# Patient Record
Sex: Female | Born: 1995 | Race: Black or African American | Hispanic: No | Marital: Single | Smoking: Current every day smoker
Health system: Southern US, Community
[De-identification: ages and names within clinical notes are randomized; demographics above are authoritative.]

## PROBLEM LIST (undated history)

## (undated) ENCOUNTER — Ambulatory Visit (HOSPITAL_COMMUNITY): Admission: EM | Payer: Medicaid Other | Source: Home / Self Care

## (undated) ENCOUNTER — Ambulatory Visit (HOSPITAL_COMMUNITY): Discharge status: Absent without leave | Payer: Medicaid Other

## (undated) ENCOUNTER — Inpatient Hospital Stay (HOSPITAL_COMMUNITY): Payer: Self-pay

## (undated) DIAGNOSIS — B009 Herpesviral infection, unspecified: Secondary | ICD-10-CM

## (undated) DIAGNOSIS — O139 Gestational [pregnancy-induced] hypertension without significant proteinuria, unspecified trimester: Secondary | ICD-10-CM

## (undated) DIAGNOSIS — N390 Urinary tract infection, site not specified: Secondary | ICD-10-CM

## (undated) DIAGNOSIS — D649 Anemia, unspecified: Secondary | ICD-10-CM

## (undated) HISTORY — PX: NO PAST SURGERIES: SHX2092

---
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
## 2016-03-01 IMAGING — US US ABDOMEN COMPLETE
1 series · 14 of 25 positions shown · non-contrast
Comparison: None.

CLINICAL DATA: Periumbilical pain for 2 days.

EXAM:
ULTRASOUND ABDOMEN COMPLETE

[Series 1: us abdomen complete · 0.22mm/px · 14 of 73 slices shown]
[im 1/73]
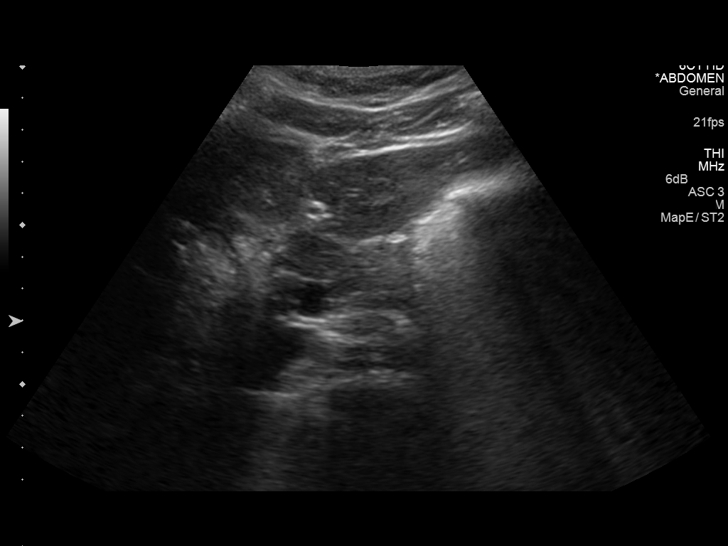
[im 7/73]
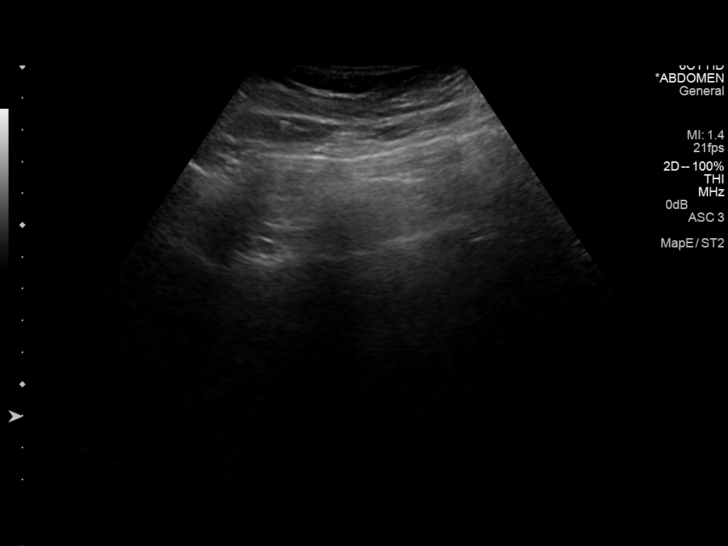
[im 13/73]
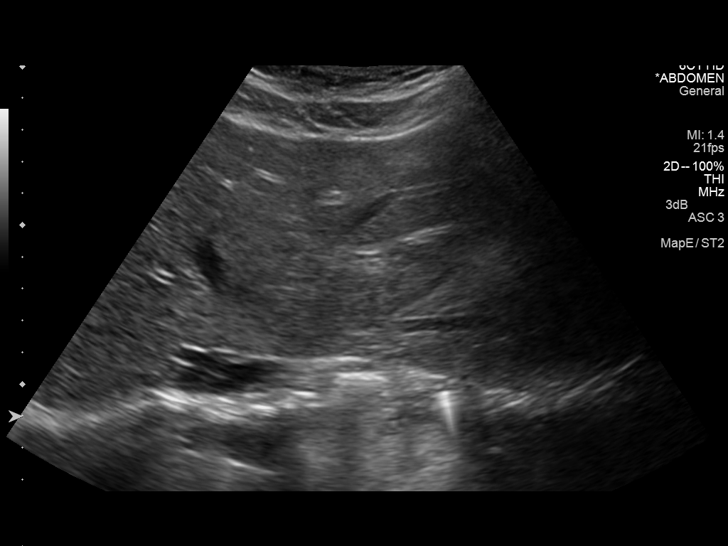
[im 19/73]
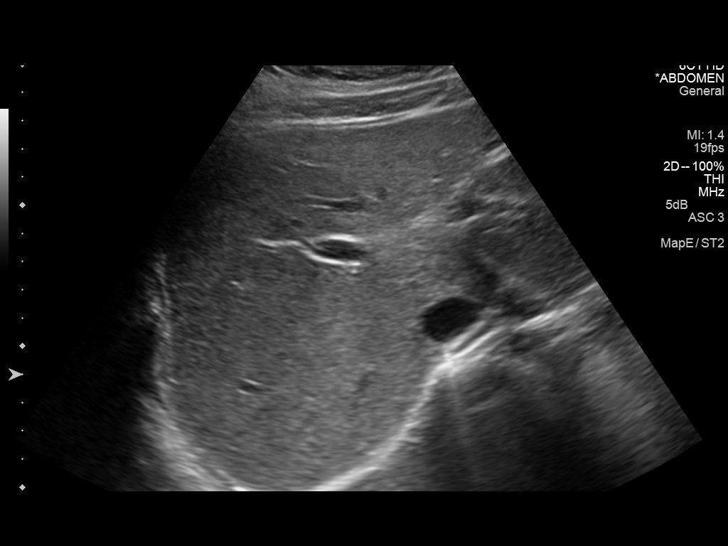
[im 25/73]
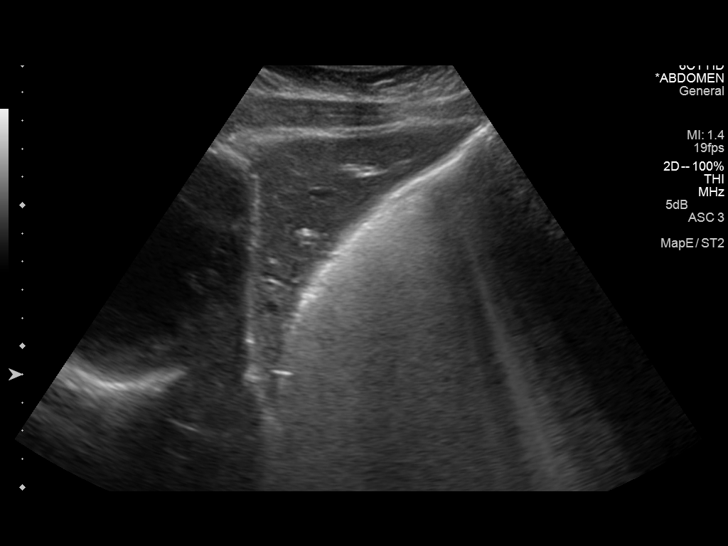
[im 28/73]
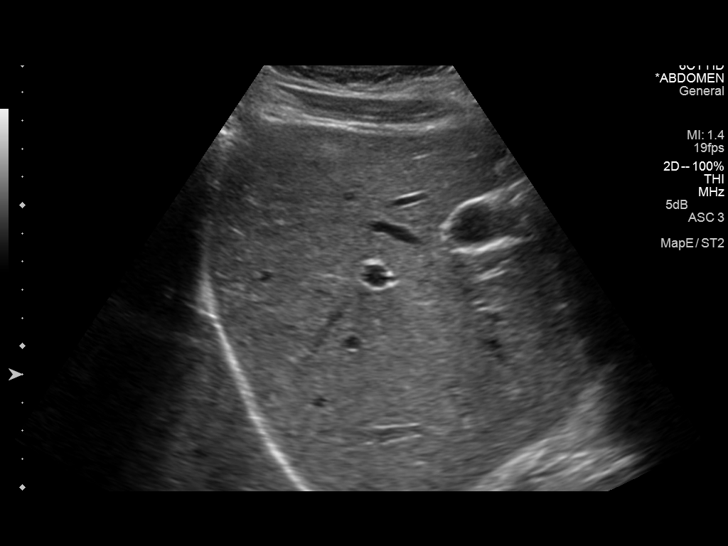
[im 34/73]
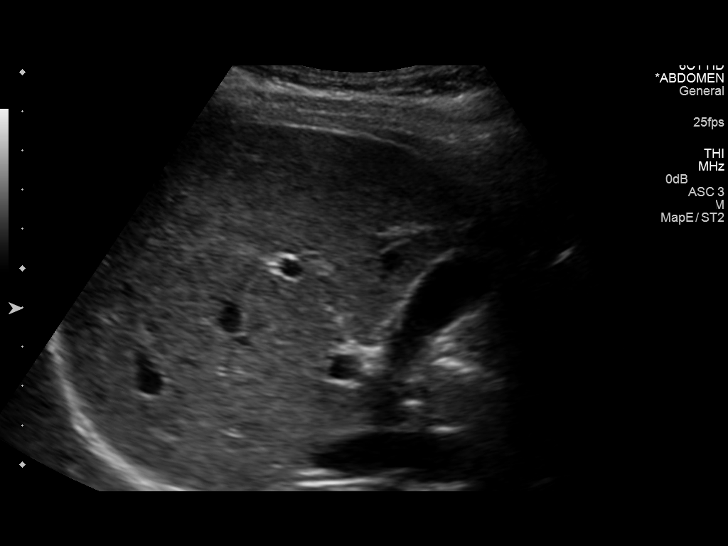
[im 40/73]
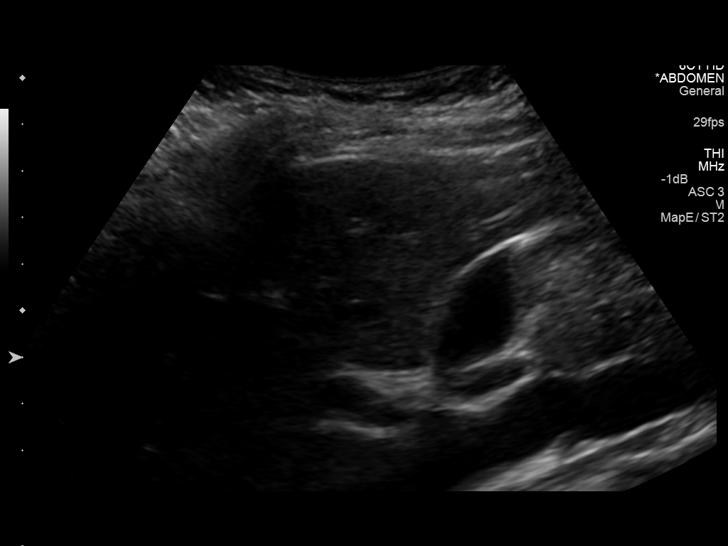
[im 46/73]
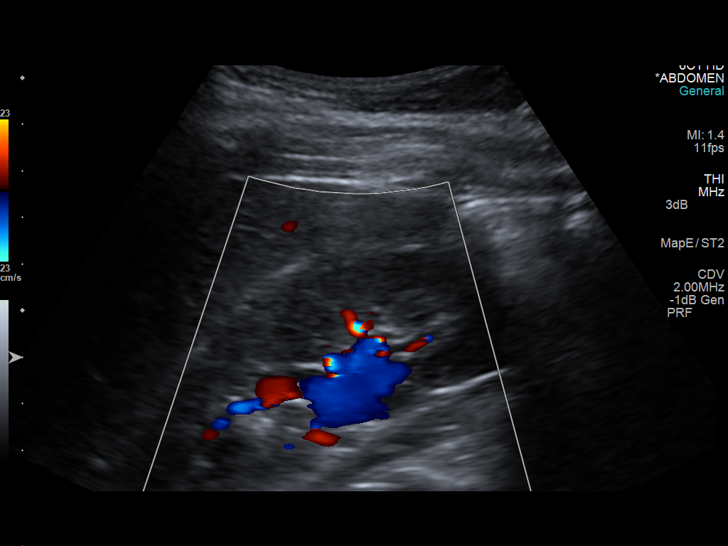
[im 49/73]
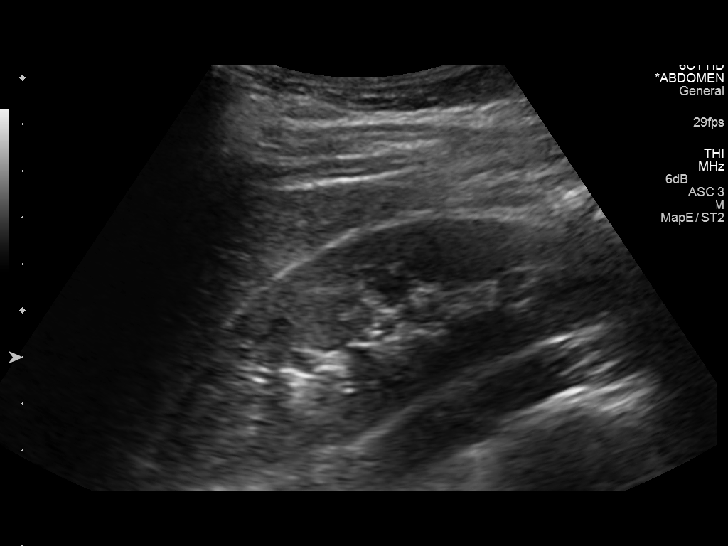
[im 55/73]
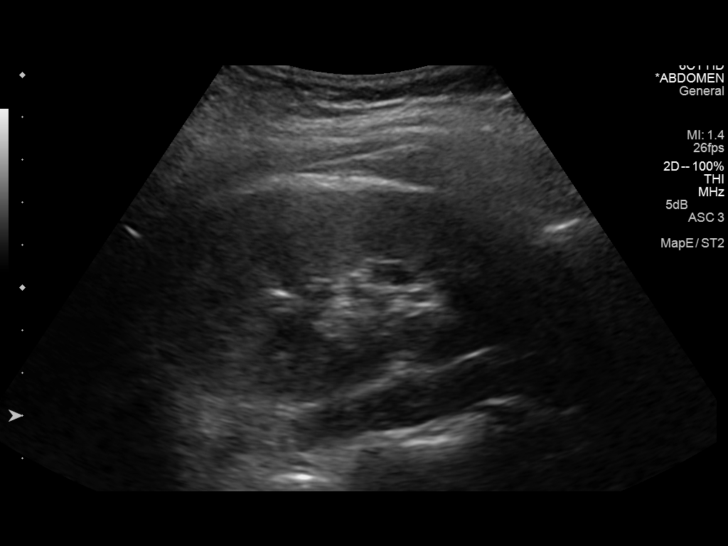
[im 61/73]
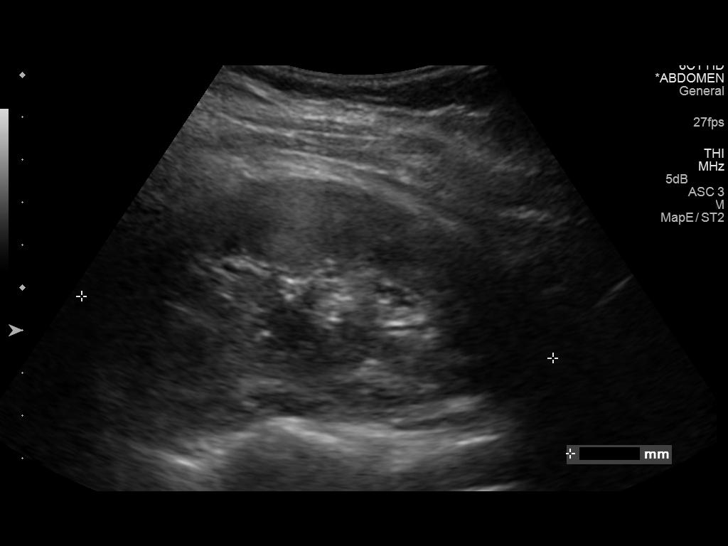
[im 67/73]
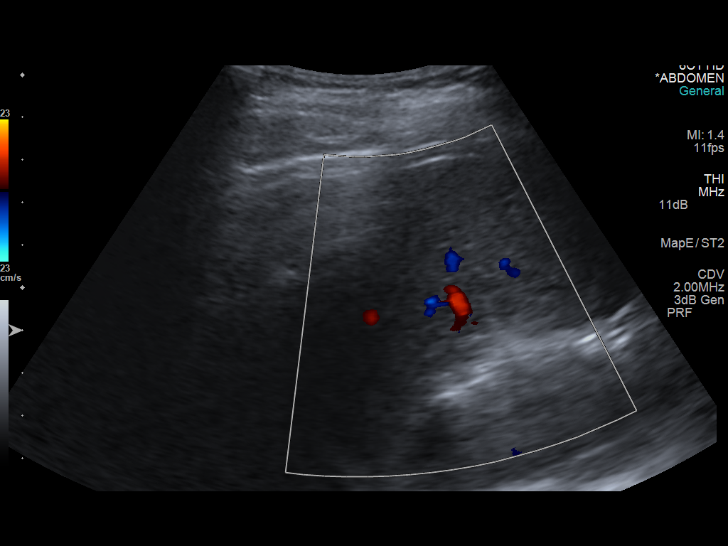
[im 73/73]
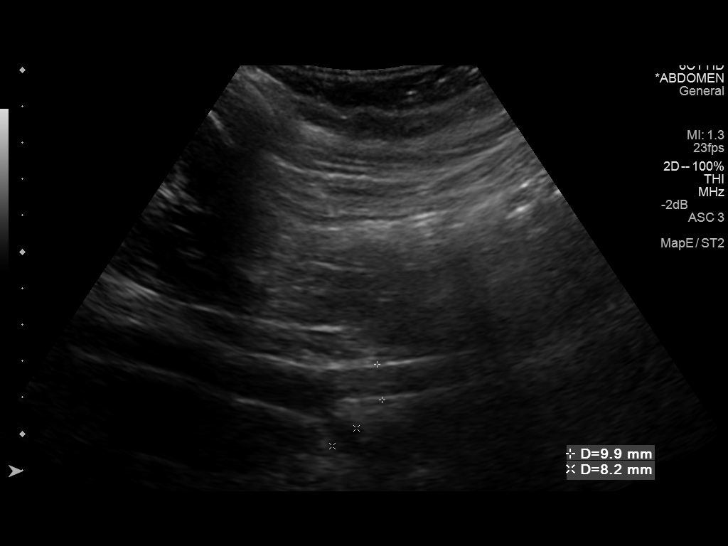

[14 of 25 positions shown; findings below may reference images not displayed]

FINDINGS: Gallbladder: No gallstones or wall thickening visualized. No
sonographic Murphy sign noted.

Common bile duct: Diameter: 3.5 mm.

Liver: No focal lesion identified. Within normal limits in
parenchymal echogenicity.

IVC: No abnormality visualized.

Pancreas: Visualized portion unremarkable.

Spleen: Size and appearance within normal limits.

Right Kidney: Length: 10.4 cm. Echogenicity within normal limits. No
mass or hydronephrosis visualized.

Left Kidney: Length: 10.8 cm. Echogenicity within normal limits. No
mass or hydronephrosis visualized.

Abdominal aorta: No aneurysm visualized.

Other findings: None.
IMPRESSION: 1. Normal abdominal sonogram. No findings to explain patient's
periumbilical pain

## 2016-07-11 NOTE — L&D Delivery Note (Signed)
Patient is 21 y.o. G2P1001 6239w0d admitted for IOL for GHTN.   Delivery Note At 2255, a viable female was delivered via  SVD, Presentation: cephalic,LOA. APGAR: 9,9 ; weight pending.   Placenta status: spontaneous, intact. Cord: 3 vessel  Anesthesia:  epidural Episiotomy:  none Lacerations:  none Suture Repair: n/a Est. Blood Loss (mL): 250  Mom to postpartum.  Baby to Couplet care / Skin to Skin.  Rolm BookbinderAmber Mafalda Mcginniss, DO [facility]

## 2016-11-26 ENCOUNTER — Encounter (HOSPITAL_COMMUNITY): Payer: Self-pay | Admitting: Emergency Medicine

## 2016-11-26 ENCOUNTER — Ambulatory Visit (HOSPITAL_COMMUNITY)
Admission: EM | Admit: 2016-11-26 | Discharge: 2016-11-26 | Disposition: A | Discharge status: Home / Self Care | Payer: Medicaid Other | Attending: Internal Medicine | Admitting: Internal Medicine

## 2016-11-26 DIAGNOSIS — N898 Other specified noninflammatory disorders of vagina: Secondary | ICD-10-CM | POA: Diagnosis not present

## 2016-11-26 DIAGNOSIS — R112 Nausea with vomiting, unspecified: Secondary | ICD-10-CM

## 2016-11-26 DIAGNOSIS — Z3201 Encounter for pregnancy test, result positive: Secondary | ICD-10-CM | POA: Diagnosis not present

## 2016-11-26 DIAGNOSIS — R109 Unspecified abdominal pain: Secondary | ICD-10-CM | POA: Diagnosis not present

## 2016-11-26 DIAGNOSIS — Z3A01 Less than 8 weeks gestation of pregnancy: Secondary | ICD-10-CM

## 2016-11-26 LAB — POCT URINALYSIS DIP (DEVICE)
Bilirubin Urine: NEGATIVE
Glucose, UA: NEGATIVE mg/dL
Ketones, ur: NEGATIVE mg/dL
Nitrite: NEGATIVE
PH: 7 (ref 5.0–8.0)
Protein, ur: NEGATIVE mg/dL
SPECIFIC GRAVITY, URINE: 1.02 (ref 1.005–1.030)
Urobilinogen, UA: 2 mg/dL — ABNORMAL HIGH (ref 0.0–1.0)

## 2016-11-26 LAB — POCT PREGNANCY, URINE: PREG TEST UR: POSITIVE — AB

## 2016-11-26 MED ORDER — PRENATAL COMPLETE 14-0.4 MG PO TABS: 1.0000 | ORAL_TABLET | ORAL | 0 refills | Status: AC

## 2016-11-26 MED ORDER — DOXYLAMINE-PYRIDOXINE 10-10 MG PO TBEC: 1.0000 | DELAYED_RELEASE_TABLET | Freq: Four times a day (QID) | ORAL | 0 refills | Status: DC | PRN

## 2016-11-26 NOTE — ED Provider Notes (Signed)
CSN: [id number]     Arrival date & time 11/26/16  1434 History   First MD Initiated Contact with Patient 11/26/16 1556     Chief Complaint  Patient presents with  . Abdominal Pain  . Possible Pregnancy   (Consider location/radiation/quality/duration/timing/severity/associated sxs/prior Treatment) 21 year old female presents to clinic with a chief complaint of morning sickness. States she has been vomiting most mornings for the last 2-3 weeks. And she has also had a clear vaginal discharge. She denies any abdominal pain, and she denies any vaginal bleeding. She recently had a nexplenon implant placed. Her last menstrual cycle was 10/22/2016. She is sexually active, without condom use.   The history is provided by the patient.  Abdominal Pain  Associated symptoms: nausea, vaginal discharge (clear) and vomiting   Associated symptoms: no diarrhea, no dysuria and no vaginal bleeding   Possible Pregnancy  Pertinent negatives include no abdominal pain.    Past Medical History:  Diagnosis Date  . Medical history non-contributory    Past Surgical History:  Procedure Laterality Date  . NO PAST SURGERIES     History reviewed. No pertinent family history. Social History  Substance Use Topics  . Smoking status: Current Some Day Smoker  . Smokeless tobacco: Never Used  . Alcohol use Yes   OB History    Gravida Para Term Preterm AB Living   1 1 1  0 0 1   SAB TAB Ectopic Multiple Live Births   0 0 0 0 1     Review of Systems  Respiratory: Negative.   Cardiovascular: Negative.   Gastrointestinal: Positive for nausea and vomiting. Negative for abdominal pain and diarrhea.  Genitourinary: Positive for vaginal discharge (clear). Negative for dysuria, frequency, menstrual problem, pelvic pain and vaginal bleeding.  Musculoskeletal: Negative.   Skin: Negative.   Neurological: Negative.     Allergies  Peanuts [peanut oil] and Nickel  Home Medications   Prior to Admission medications    Medication Sig Start Date End Date Taking? Authorizing Provider  Doxylamine-Pyridoxine (DICLEGIS) 10-10 MG TBEC Take 1 tablet by mouth 4 (four) times daily as needed. 11/26/16   Dorena Bodo, NP  etonogestrel (NEXPLANON) 68 MG IMPL implant 1 each (68 mg total) by Subdermal route once. 10/06/14   Montez Morita, CNM  Prenatal Vit-Fe Fumarate-FA (PRENATAL COMPLETE) 14-0.4 MG TABS Take 1 tablet by mouth 1 day or 1 dose. 11/26/16 11/27/16  Dorena Bodo, NP   Meds Ordered and Administered this Visit  Medications - No data to display  BP 131/69 (BP Location: Right Arm)   Pulse 87   Temp 99.5 F (37.5 C) (Oral)   Resp 18   LMP 10/22/2016   SpO2 100%   Breastfeeding? No  No data found.   Physical Exam  Constitutional: She is oriented to person, place, and time. She appears well-developed and well-nourished. No distress.  HENT:  Head: Normocephalic and atraumatic.  Right Ear: External ear normal.  Left Ear: External ear normal.  Eyes: Conjunctivae are normal.  Neck: Normal range of motion.  Cardiovascular: Normal rate and regular rhythm.   Pulmonary/Chest: Effort normal and breath sounds normal.  Abdominal: Soft. Bowel sounds are normal.  Neurological: She is alert and oriented to person, place, and time.  Skin: Skin is warm and dry. Capillary refill takes less than 2 seconds. No rash noted. She is not diaphoretic. No erythema.  Psychiatric: She has a normal mood and affect. Her behavior is normal.  Nursing note and vitals reviewed.  Urgent Care Course     Procedures (including critical care time)  Labs Review Labs Reviewed  POCT URINALYSIS DIP (DEVICE) - Abnormal; Notable for the following:       Result Value   Hgb urine dipstick TRACE (*)    Urobilinogen, UA 2.0 (*)    Leukocytes, UA TRACE (*)    All other components within normal limits  POCT PREGNANCY, URINE - Abnormal; Notable for the following:    Preg Test, Ur POSITIVE (*)    All other components within  normal limits    Imaging Review No results found.    MDM   1. Less than [redacted] weeks gestation of pregnancy    Pregnancy test positive, started on prenatal vitamins, diclegis for nausea, [facility]. Counseled to go to the ER if she has any vaginal bleeding or abdominal/pelvic pain.    Dorena BodoKennard, Hester Forget, NP 11/26/16 1616

## 2016-11-26 NOTE — ED Triage Notes (Signed)
Pt c/o constant abd pain onset 3 weeks associated w/vomiting and spotting  Pt believes she may be pregnant... sts she took a pregnancy test at home it came back positive and [facility]  LMP = 10/22/2016  A&O x4... NAD... Ambulatory

## 2016-11-26 NOTE — Discharge Instructions (Signed)
You have a positive of pregnancy test. If you have any abdominal pain, or vaginal bleeding, [facility]. I have started you on prenatal vitamins, and a medicine for nausea called Diclegis. I have made a referral to the women's outpatient center, called them Monday morning to schedule an appointment for follow-up care.

## 2017-02-02 LAB — OB RESULTS CONSOLE RUBELLA ANTIBODY, IGM: Rubella: IMMUNE

## 2017-02-02 LAB — OB RESULTS CONSOLE HIV ANTIBODY (ROUTINE TESTING): HIV: NONREACTIVE

## 2017-02-02 LAB — OB RESULTS CONSOLE GC/CHLAMYDIA: Gonorrhea: NEGATIVE

## 2017-04-19 LAB — OB RESULTS CONSOLE RPR: RPR: NONREACTIVE

## 2017-05-16 ENCOUNTER — Inpatient Hospital Stay (HOSPITAL_COMMUNITY)
Admission: AD | Admit: 2017-05-16 | Discharge: 2017-05-16 | Disposition: A | Discharge status: Home / Self Care | Payer: Medicaid Other | Source: Ambulatory Visit | Attending: Obstetrics & Gynecology | Admitting: Obstetrics & Gynecology

## 2017-05-16 ENCOUNTER — Encounter (HOSPITAL_COMMUNITY): Payer: Self-pay | Admitting: *Deleted

## 2017-05-16 DIAGNOSIS — O26893 Other specified pregnancy related conditions, third trimester: Secondary | ICD-10-CM | POA: Diagnosis not present

## 2017-05-16 DIAGNOSIS — Z7982 Long term (current) use of aspirin: Secondary | ICD-10-CM | POA: Insufficient documentation

## 2017-05-16 DIAGNOSIS — R102 Pelvic and perineal pain: Secondary | ICD-10-CM | POA: Diagnosis present

## 2017-05-16 DIAGNOSIS — Z3A32 32 weeks gestation of pregnancy: Secondary | ICD-10-CM | POA: Insufficient documentation

## 2017-05-16 DIAGNOSIS — N949 Unspecified condition associated with female genital organs and menstrual cycle: Secondary | ICD-10-CM

## 2017-05-16 DIAGNOSIS — R109 Unspecified abdominal pain: Secondary | ICD-10-CM | POA: Diagnosis not present

## 2017-05-16 HISTORY — DX: Urinary tract infection, site not specified: N39.0

## 2017-05-16 HISTORY — DX: Gestational (pregnancy-induced) hypertension without significant proteinuria, unspecified trimester: O13.9

## 2017-05-16 HISTORY — DX: Herpesviral infection, unspecified: B00.9

## 2017-05-16 HISTORY — DX: Anemia, unspecified: D64.9

## 2017-05-16 LAB — URINALYSIS, ROUTINE W REFLEX MICROSCOPIC
Bilirubin Urine: NEGATIVE
GLUCOSE, UA: NEGATIVE mg/dL
HGB URINE DIPSTICK: NEGATIVE
Ketones, ur: NEGATIVE mg/dL
NITRITE: NEGATIVE
Protein, ur: 100 mg/dL — AB
SPECIFIC GRAVITY, URINE: 1.025 (ref 1.005–1.030)
pH: 6 (ref 5.0–8.0)

## 2017-05-16 LAB — WET PREP, GENITAL
SPERM: NONE SEEN
Trich, Wet Prep: NONE SEEN
Yeast Wet Prep HPF POC: NONE SEEN

## 2017-05-16 NOTE — MAU Provider Note (Signed)
History     CSN: [id number]  Arrival date and time: 05/16/17 1349   First Provider Initiated Contact with Patient 05/16/17 1453      Chief Complaint  Patient presents with  . Abdominal Pain   Samantha Warner is a 21 y.o. G2P1001 at 5596w3d who presents today with abdominal pain x 3 days. She denies any VB or LOF. She reports normal fetal movement.    Abdominal Pain  This is a new problem. Episode onset: 3 days ago. The onset quality is sudden. The problem occurs constantly. The problem has been unchanged. The pain is located in the suprapubic region. The pain is at a severity of 7/10. The quality of the pain is aching. The abdominal pain does not radiate. Pertinent negatives include no constipation (last BM today), dysuria, fever, frequency, nausea or vomiting. Nothing aggravates the pain. The pain is relieved by nothing. She has tried nothing for the symptoms.    Past Medical History:  Diagnosis Date  . Anemia   . Herpes   . Pregnancy induced hypertension   . UTI (urinary tract infection)     Past Surgical History:  Procedure Laterality Date  . NO PAST SURGERIES      History reviewed. No pertinent family history.  Social History   Tobacco Use  . Smoking status: Former Smoker    Types: Cigarettes    Last attempt to quit: 10/14/2016    Years since quitting: 0.5  . Smokeless tobacco: Never Used  Substance Use Topics  . Alcohol use: Yes  . Drug use: Yes    Types: Marijuana, Cocaine    Comment: occ marijuana use during pregnancy    Allergies:  Allergies  Allergen Reactions  . Peanuts [Peanut Oil] Anaphylaxis  . Nickel Rash    Medications Prior to Admission  Medication Sig Dispense Refill Last Dose  . aspirin EC 81 MG tablet Take 81 mg daily by mouth.   05/15/2017 at Unknown time  . Prenatal Vit-Fe Fumarate-FA (PRENATAL MULTIVITAMIN) TABS tablet Take 1 tablet daily at 12 noon by mouth.   05/15/2017 at Unknown time  . Doxylamine-Pyridoxine (DICLEGIS) 10-10 MG TBEC  Take 1 tablet by mouth 4 (four) times daily as needed. (Patient not taking: Reported on 05/16/2017) 60 tablet 0 Not Taking at Unknown time    Review of Systems  Constitutional: Negative for chills and fever.  Gastrointestinal: Positive for abdominal pain. Negative for constipation (last BM today), nausea and vomiting.  Genitourinary: Negative for dysuria, frequency, urgency, vaginal bleeding and vaginal discharge.   Physical Exam   Blood pressure 130/72, pulse 94, temperature 98.8 F (37.1 C), temperature source Oral, resp. rate 17, height 5\' 6"  (1.676 m), weight 186 lb (84.4 kg), last menstrual period 10/22/2016, SpO2 100 %.  Physical Exam  Nursing note and vitals reviewed. Constitutional: She is oriented to person, place, and time. She appears well-developed and well-nourished. No distress.  HENT:  Head: Normocephalic.  Cardiovascular: Normal rate.  Respiratory: Effort normal.  GI: Soft. There is no tenderness. There is no rebound.  Genitourinary:  Genitourinary Comments: Cervix closed/thick   Neurological: She is alert and oriented to person, place, and time.  Skin: Skin is warm and dry.  Psychiatric: She has a normal mood and affect.   FHT: 120, moderate with 15x15 accels, no decels Toco: no UCs   Results for orders placed or performed during the hospital encounter of 05/16/17 (from the past 24 hour(s))  Urinalysis, Routine w reflex microscopic  Status: Abnormal   Collection Time: 05/16/17  2:13 PM  Result Value Ref Range   Color, Urine AMBER (A) YELLOW   APPearance HAZY (A) CLEAR   Specific Gravity, Urine 1.025 1.005 - 1.030   pH 6.0 5.0 - 8.0   Glucose, UA NEGATIVE NEGATIVE mg/dL   Hgb urine dipstick NEGATIVE NEGATIVE   Bilirubin Urine NEGATIVE NEGATIVE   Ketones, ur NEGATIVE NEGATIVE mg/dL   Protein, ur 811100 (A) NEGATIVE mg/dL   Nitrite NEGATIVE NEGATIVE   Leukocytes, UA TRACE (A) NEGATIVE   RBC / HPF 0-5 0 - 5 RBC/hpf   WBC, UA 6-30 0 - 5 WBC/hpf   Bacteria, UA  RARE (A) NONE SEEN   Squamous Epithelial / LPF 6-30 (A) NONE SEEN   Mucus PRESENT   Wet prep, genital     Status: Abnormal   Collection Time: 05/16/17  3:09 PM  Result Value Ref Range   Yeast Wet Prep HPF POC NONE SEEN NONE SEEN   Trich, Wet Prep NONE SEEN NONE SEEN   Clue Cells Wet Prep HPF POC PRESENT (A) NONE SEEN   WBC, Wet Prep HPF POC MODERATE (A) NONE SEEN   Sperm NONE SEEN     MAU Course  Procedures  MDM   Assessment and Plan   1. Round ligament pain   2. Abdominal pain during pregnancy in third trimester   3. [redacted] weeks gestation of pregnancy    DC home Comfort measures reviewed  3rd Trimester precautions  PTL precautions  Fetal kick counts RX: no new RX, continue current meds. Patient is not taking ASA daily. Advised patient on the importance of this medication in preventing complications.  Return to MAU as needed FU with OB as planned  Follow-up Information    [facility]   Specialty:  Obstetrics and Gynecology Contact information: [address] [place] [id number] [phone]           Thressa ShellerHeather Hogan 05/16/2017, 3:41 PM

## 2017-05-16 NOTE — Discharge Instructions (Signed)

## 2017-05-16 NOTE — MAU Note (Signed)
Pt reports abd pain x 3 days, states it feels achy in her lower abd, denies bleeding.

## 2017-05-17 LAB — GC/CHLAMYDIA PROBE AMP (~~LOC~~) NOT AT ARMC
Chlamydia: NEGATIVE
Neisseria Gonorrhea: NEGATIVE

## 2017-06-14 ENCOUNTER — Telehealth (HOSPITAL_COMMUNITY): Payer: Self-pay | Admitting: *Deleted

## 2017-06-14 ENCOUNTER — Other Ambulatory Visit: Payer: Self-pay | Admitting: Advanced Practice Midwife

## 2017-06-14 NOTE — Telephone Encounter (Signed)
Preadmission screen  

## 2017-06-17 ENCOUNTER — Inpatient Hospital Stay (HOSPITAL_COMMUNITY)
Admission: RE | Admit: 2017-06-17 | Discharge: 2017-06-19 | DRG: 807 | Disposition: A | Discharge status: Home / Self Care | Payer: Medicaid Other | Source: Ambulatory Visit | Attending: Obstetrics and Gynecology | Admitting: Obstetrics and Gynecology

## 2017-06-17 ENCOUNTER — Inpatient Hospital Stay (HOSPITAL_COMMUNITY): Payer: Medicaid Other | Admitting: Anesthesiology

## 2017-06-17 DIAGNOSIS — Z3A37 37 weeks gestation of pregnancy: Secondary | ICD-10-CM | POA: Diagnosis not present

## 2017-06-17 DIAGNOSIS — Z87891 Personal history of nicotine dependence: Secondary | ICD-10-CM

## 2017-06-17 DIAGNOSIS — O134 Gestational [pregnancy-induced] hypertension without significant proteinuria, complicating childbirth: Principal | ICD-10-CM | POA: Diagnosis present

## 2017-06-17 DIAGNOSIS — O9902 Anemia complicating childbirth: Secondary | ICD-10-CM | POA: Diagnosis present

## 2017-06-17 DIAGNOSIS — Z7982 Long term (current) use of aspirin: Secondary | ICD-10-CM | POA: Diagnosis not present

## 2017-06-17 DIAGNOSIS — D649 Anemia, unspecified: Secondary | ICD-10-CM | POA: Diagnosis present

## 2017-06-17 LAB — COMPREHENSIVE METABOLIC PANEL
ALK PHOS: 156 U/L — AB (ref 38–126)
ALT: 20 U/L (ref 14–54)
ANION GAP: 10 (ref 5–15)
AST: 28 U/L (ref 15–41)
Albumin: 3.4 g/dL — ABNORMAL LOW (ref 3.5–5.0)
BILIRUBIN TOTAL: 0.4 mg/dL (ref 0.3–1.2)
BUN: 6 mg/dL (ref 6–20)
CALCIUM: 9.6 mg/dL (ref 8.9–10.3)
CO2: 19 mmol/L — ABNORMAL LOW (ref 22–32)
Chloride: 106 mmol/L (ref 101–111)
Creatinine, Ser: 0.46 mg/dL (ref 0.44–1.00)
Glucose, Bld: 93 mg/dL (ref 65–99)
Potassium: 3.9 mmol/L (ref 3.5–5.1)
SODIUM: 135 mmol/L (ref 135–145)
TOTAL PROTEIN: 6.8 g/dL (ref 6.5–8.1)

## 2017-06-17 LAB — PROTEIN / CREATININE RATIO, URINE
CREATININE, URINE: 92 mg/dL
PROTEIN CREATININE RATIO: 0.25 mg/mg{creat} — AB (ref 0.00–0.15)
TOTAL PROTEIN, URINE: 23 mg/dL

## 2017-06-17 LAB — CBC
HCT: 30.4 % — ABNORMAL LOW (ref 36.0–46.0)
HEMATOCRIT: 32.1 % — AB (ref 36.0–46.0)
Hemoglobin: 10.4 g/dL — ABNORMAL LOW (ref 12.0–15.0)
Hemoglobin: 9.6 g/dL — ABNORMAL LOW (ref 12.0–15.0)
MCH: 23.3 pg — AB (ref 26.0–34.0)
MCH: 23.9 pg — AB (ref 26.0–34.0)
MCHC: 31.6 g/dL (ref 30.0–36.0)
MCHC: 32.4 g/dL (ref 30.0–36.0)
MCV: 73.6 fL — AB (ref 78.0–100.0)
MCV: 73.8 fL — AB (ref 78.0–100.0)
PLATELETS: 243 10*3/uL (ref 150–400)
PLATELETS: 297 10*3/uL (ref 150–400)
RBC: 4.12 MIL/uL (ref 3.87–5.11)
RBC: 4.36 MIL/uL (ref 3.87–5.11)
RDW: 14.3 % (ref 11.5–15.5)
RDW: 14.4 % (ref 11.5–15.5)
WBC: 15.6 10*3/uL — ABNORMAL HIGH (ref 4.0–10.5)
WBC: 9.2 10*3/uL (ref 4.0–10.5)

## 2017-06-17 LAB — RAPID URINE DRUG SCREEN, HOSP PERFORMED
Amphetamines: NOT DETECTED
Barbiturates: NOT DETECTED
Benzodiazepines: NOT DETECTED
Cocaine: NOT DETECTED
Opiates: NOT DETECTED
Tetrahydrocannabinol: NOT DETECTED

## 2017-06-17 LAB — TYPE AND SCREEN
ABO/RH(D): O POS
Antibody Screen: NEGATIVE

## 2017-06-17 LAB — GROUP B STREP BY PCR: GROUP B STREP BY PCR: NEGATIVE

## 2017-06-17 LAB — RPR: RPR Ser Ql: NONREACTIVE

## 2017-06-17 MED ORDER — DIPHENHYDRAMINE HCL 50 MG/ML IJ SOLN: 12.5000 mg | INTRAMUSCULAR | Status: DC | PRN

## 2017-06-17 MED ORDER — PHENYLEPHRINE 40 MCG/ML (10ML) SYRINGE FOR IV PUSH (FOR BLOOD PRESSURE SUPPORT)
80.0000 ug | PREFILLED_SYRINGE | INTRAVENOUS | Status: DC | PRN
  Filled 2017-06-17: qty 5
  Filled 2017-06-17: qty 10

## 2017-06-17 MED ORDER — OXYTOCIN 40 UNITS IN LACTATED RINGERS INFUSION - SIMPLE MED
2.5000 [IU]/h | INTRAVENOUS | Status: DC
  Administered 2017-06-17: 2.5 [IU]/h via INTRAVENOUS

## 2017-06-17 MED ORDER — OXYTOCIN BOLUS FROM INFUSION
500.0000 mL | Freq: Once | INTRAVENOUS | Status: AC
  Administered 2017-06-17: 500 mL via INTRAVENOUS

## 2017-06-17 MED ORDER — ACETAMINOPHEN 325 MG PO TABS: 650.0000 mg | ORAL_TABLET | ORAL | Status: DC | PRN

## 2017-06-17 MED ORDER — OXYTOCIN 40 UNITS IN LACTATED RINGERS INFUSION - SIMPLE MED
1.0000 m[IU]/min | INTRAVENOUS | Status: DC
  Administered 2017-06-17: 2 m[IU]/min via INTRAVENOUS
  Filled 2017-06-17: qty 1000

## 2017-06-17 MED ORDER — FENTANYL 2.5 MCG/ML BUPIVACAINE 1/10 % EPIDURAL INFUSION (WH - ANES)
14.0000 mL/h | INTRAMUSCULAR | Status: DC | PRN
  Administered 2017-06-17: 14 mL/h via EPIDURAL
  Filled 2017-06-17: qty 100

## 2017-06-17 MED ORDER — TERBUTALINE SULFATE 1 MG/ML IJ SOLN
0.2500 mg | Freq: Once | INTRAMUSCULAR | Status: DC | PRN
Start: 2017-06-17 — End: 2017-06-18
  Filled 2017-06-17: qty 1

## 2017-06-17 MED ORDER — LACTATED RINGERS IV SOLN
500.0000 mL | Freq: Once | INTRAVENOUS | Status: AC
  Administered 2017-06-17: 500 mL via INTRAVENOUS

## 2017-06-17 MED ORDER — OXYCODONE-ACETAMINOPHEN 5-325 MG PO TABS: 1.0000 | ORAL_TABLET | ORAL | Status: DC | PRN

## 2017-06-17 MED ORDER — SOD CITRATE-CITRIC ACID 500-334 MG/5ML PO SOLN: 30.0000 mL | ORAL | Status: DC | PRN

## 2017-06-17 MED ORDER — ONDANSETRON HCL 4 MG/2ML IJ SOLN
4.0000 mg | Freq: Four times a day (QID) | INTRAMUSCULAR | Status: DC | PRN
  Administered 2017-06-17: 4 mg via INTRAVENOUS
  Filled 2017-06-17: qty 2

## 2017-06-17 MED ORDER — PHENYLEPHRINE 40 MCG/ML (10ML) SYRINGE FOR IV PUSH (FOR BLOOD PRESSURE SUPPORT)
80.0000 ug | PREFILLED_SYRINGE | INTRAVENOUS | Status: DC | PRN
Start: 2017-06-17 — End: 2017-06-18
  Filled 2017-06-17: qty 5

## 2017-06-17 MED ORDER — EPHEDRINE 5 MG/ML INJ
10.0000 mg | INTRAVENOUS | Status: DC | PRN
  Filled 2017-06-17: qty 2

## 2017-06-17 MED ORDER — ZOLPIDEM TARTRATE 5 MG PO TABS: 5.0000 mg | ORAL_TABLET | Freq: Every evening | ORAL | Status: DC | PRN

## 2017-06-17 MED ORDER — OXYCODONE-ACETAMINOPHEN 5-325 MG PO TABS: 2.0000 | ORAL_TABLET | ORAL | Status: DC | PRN

## 2017-06-17 MED ORDER — LACTATED RINGERS IV SOLN
INTRAVENOUS | Status: DC
  Administered 2017-06-17: 08:00:00 via INTRAVENOUS

## 2017-06-17 MED ORDER — LIDOCAINE HCL (PF) 1 % IJ SOLN
30.0000 mL | INTRAMUSCULAR | Status: DC | PRN
  Filled 2017-06-17: qty 30

## 2017-06-17 MED ORDER — FENTANYL CITRATE (PF) 100 MCG/2ML IJ SOLN
100.0000 ug | INTRAMUSCULAR | Status: DC | PRN
  Administered 2017-06-17 (×3): 100 ug via INTRAVENOUS
  Filled 2017-06-17 (×3): qty 2

## 2017-06-17 MED ORDER — LACTATED RINGERS IV SOLN
500.0000 mL | INTRAVENOUS | Status: DC | PRN
  Administered 2017-06-17: 500 mL via INTRAVENOUS

## 2017-06-17 MED ORDER — FENTANYL 2.5 MCG/ML BUPIVACAINE 1/10 % EPIDURAL INFUSION (WH - ANES)
INTRAMUSCULAR | Status: DC | PRN
  Administered 2017-06-17: 14 mL/h via EPIDURAL

## 2017-06-17 MED ORDER — MISOPROSTOL 25 MCG QUARTER TABLET
25.0000 ug | ORAL_TABLET | ORAL | Status: DC | PRN
  Administered 2017-06-17 (×2): 25 ug via VAGINAL
  Filled 2017-06-17 (×3): qty 1

## 2017-06-17 MED ORDER — LIDOCAINE HCL (PF) 1 % IJ SOLN
INTRAMUSCULAR | Status: DC | PRN
  Administered 2017-06-17: 6 mL via EPIDURAL
  Administered 2017-06-17: 4 mL

## 2017-06-17 MED ORDER — TERBUTALINE SULFATE 1 MG/ML IJ SOLN: 0.2500 mg | Freq: Once | INTRAMUSCULAR | Status: DC | PRN

## 2017-06-17 NOTE — Progress Notes (Signed)
Samantha Warner is a 21 y.o. G2P1001 at 8642w0d  admitted for induction of labor due to Hypertension.  Subjective:  Starting to get uncomfortable with contractions. She is requesting AROM.  Objective: BP 128/73   Pulse 91   Temp 98.3 F (36.8 C) (Oral)   Resp 18   Ht 5\' 6"  (1.676 m)   Wt 188 lb (85.3 kg)   LMP 10/22/2016   BMI 30.34 kg/m  No intake/output data recorded. No intake/output data recorded.  FHT:  FHR: 120 bpm, variability: moderate,  accelerations:  Present,  decelerations:  Absent UC:   regular, every 2-4 minutes SVE:   Dilation: 4 Effacement (%): Thick Station: -3 Exam by:: Whole FoodsH Hogan CNM  Labs: Lab Results  Component Value Date   WBC 9.2 06/17/2017   HGB 9.6 (L) 06/17/2017   HCT 30.4 (L) 06/17/2017   MCV 73.8 (L) 06/17/2017   PLT 297 06/17/2017    Assessment / Plan: Induction of labor. Just now uncomfortable. Will continue to increase pitocin and will AROM once head is more applied to cervix.   Labor: Progressing on Pitocin, will continue to increase then AROM Preeclampsia:  No severe features  Fetal Wellbeing:  Category I Pain Control:  Labor support without medications I/D:  n/a Anticipated MOD:  NSVD  Thressa ShellerHeather Hogan 06/17/2017, 4:16 PM

## 2017-06-17 NOTE — Anesthesia Procedure Notes (Signed)
Epidural Patient location during procedure: OB  Staffing Anesthesiologist: Mason Dibiasio, MD  Preanesthetic Checklist Completed: patient identified, pre-op evaluation, timeout performed, IV checked, risks and benefits discussed and monitors and equipment checked  Epidural Patient position: sitting Prep: DuraPrep Patient monitoring: blood pressure and continuous pulse ox Approach: right paramedian Location: L3-L4 Injection technique: LOR air  Needle:  Needle type: Tuohy  Needle gauge: 17 G Needle insertion depth: 5 cm Catheter type: closed end flexible Catheter size: 19 Gauge Catheter at skin depth: 10 cm Test dose: negative  Assessment Sensory level: T8  Additional Notes   Dosing of Epidural:  1st dose, through catheter .............................................  Xylocaine 40 mg  2nd dose, through catheter, after waiting 3 minutes.........Xylocaine 60 mg    As each dose occurred, patient was free of IV sx; and patient exhibited no evidence of SA injection.  Patient is more comfortable after epidural dosed. Please see RN's note for documentation of vital signs,and FHR which are stable.  Patient reminded not to try to ambulate with numb legs, and that an RN must be present when she attempts to get up.          

## 2017-06-17 NOTE — H&P (Signed)
LABOR AND DELIVERY ADMISSION HISTORY AND PHYSICAL NOTE  Samantha Warner is a 21 y.o. female G2P1001 with IUP at 9579w0d by ultrasound presenting for IOL for gestational hypertension.  She reports positive fetal movement. She denies leakage of fluid or vaginal bleeding.  [facility]/Complications[facility] Pregnancy complications:  - gestational HTN - anemia  - UTI during pregnancy (Tx with macrobid, UA wnl)  Past Medical History: Past Medical History:  Diagnosis Date  . Anemia   . Herpes   . Pregnancy induced hypertension   . UTI (urinary tract infection)     Past Surgical History: Past Surgical History:  Procedure Laterality Date  . NO PAST SURGERIES      Obstetrical History: OB History    Gravida Para Term Preterm AB Living   2 1 1  0 0 1   SAB TAB Ectopic Multiple Live Births   0 0 0 0 1      Social History: Social History   Socioeconomic History  . Marital status: Single    Spouse name: Not on file  . Number of children: Not on file  . Years of education: Not on file  . Highest education level: Not on file  Social Needs  . Financial resource strain: Not on file  . Food insecurity - worry: Not on file  . Food insecurity - inability: Not on file  . Transportation needs - medical: Not on file  . Transportation needs - non-medical: Not on file  Occupational History  . Not on file  Tobacco Use  . Smoking status: Former Smoker    Types: Cigarettes    Last attempt to quit: 10/14/2016    Years since quitting: 0.6  . Smokeless tobacco: Never Used  Substance and Sexual Activity  . Alcohol use: Yes  . Drug use: Yes    Types: Marijuana, Cocaine    Comment: occ marijuana use during pregnancy  . Sexual activity: Yes  Other Topics Concern  . Not on file  Social History Narrative  . Not on file    Family History: No family history on file.  Allergies: Allergies  Allergen Reactions  . Peanuts [Peanut Oil] Anaphylaxis  . Nickel  Rash    Medications Prior to Admission  Medication Sig Dispense Refill Last Dose  . aspirin EC 81 MG tablet Take 81 mg daily by mouth.   05/15/2017 at Unknown time  . Doxylamine-Pyridoxine (DICLEGIS) 10-10 MG TBEC Take 1 tablet by mouth 4 (four) times daily as needed. (Patient not taking: Reported on 05/16/2017) 60 tablet 0 Not Taking at Unknown time  . Prenatal Vit-Fe Fumarate-FA (PRENATAL MULTIVITAMIN) TABS tablet Take 1 tablet daily at 12 noon by mouth.   05/15/2017 at Unknown time     Review of Systems  All systems reviewed and negative except as stated in HPI  Physical Exam Last menstrual period 10/22/2016. General appearance: alert, cooperative and no distress Lungs: clear to auscultation bilaterally Heart: regular rate and rhythm Abdomen: soft, non-tender; bowel sounds normal Extremities: No calf swelling or tenderness Presentation: cephalic  Fetal monitoring: FHR 115, moderate variability, accels 15x15, no decels Uterine activity: irritability    Prenatal labs: ABO, Rh:  O positive Antibody:  Negative Rubella:  Immune RPR:   Negative HBsAg:  Negative  HIV:   Nonreactive (02/02/2017) GC/Chlamydia: negative GBS:  pending 3 hr glucose test: normal Genetic screening: normal Anatomy US: female  Prenatal Transfer Tool  Maternal Diabetes: No Genetic Screening: Normal Maternal Ultrasounds/Referrals: Normal Fetal  Ultrasounds or other Referrals:  None Maternal Substance Abuse: Yes:  Type: Marijuana, tobacco, question of past cocaine use Significant Maternal Medications:  None Significant Maternal Lab Results: GBS neg, HIV pending  No results found for this or any previous visit (from the past 24 hour(s)).  Patient Active Problem List   Diagnosis Date Noted  . Labor and delivery, indication for care 06/17/2017  . Gestational hypertension 10/02/2014  . Encounter for fetal anatomic survey   . [redacted] weeks gestation of pregnancy     Assessment: Samantha Warner is a 21 y.o.  G2P1001 at 6041w0d here for IOL for gestational HTN.   #Labor: Vaginal cytotec given, will place Foley bulb later this am #Pain: Epidural planned #FWB: Category 1 #ID:  GBS neg #MOF: Breast #MOC: Nexplanon #Circ:  Yes, as outpt #Question of substance abuse: UDS ordered  Francie MassingMelissa  Leldon Steege 06/17/2017, 12:29 AM

## 2017-06-17 NOTE — Progress Notes (Signed)
Samantha Warner is Samantha 21 y.o. G2P1001 at 4134w0d  admitted for induction of labor due to Hypertension.  Subjective:  No complaints. Not uncomfortable with contractions at this time. Planning epidural when needed.  Objective: BP (!) 155/81   Pulse 87   Temp 98.3 F (36.8 C) (Oral)   Resp 16   Ht 5\' 6"  (1.676 m)   Wt 188 lb (85.3 kg)   LMP 10/22/2016   BMI 30.34 kg/m  No intake/output data recorded. No intake/output data recorded.  FHT:  FHR: 130 bpm, variability: moderate,  accelerations:  Present,  decelerations:  Absent UC:   regular, every 2-3 minutes SVE:   Dilation: 4 Effacement (%): 50 Station: -3 Exam by:: Samantha Showfety RN  Labs: Lab Results  Component Value Date   WBC 9.2 06/17/2017   HGB 9.6 (L) 06/17/2017   HCT 30.4 (L) 06/17/2017   MCV 73.8 (L) 06/17/2017   PLT 297 06/17/2017    Assessment / Plan: Induction of labor due to gestational hypertension,  progressing well on pitocin  Labor: Progressing on Pitocin, will continue to increase then AROM Preeclampsia:  no severe features  Fetal Wellbeing:  Category I Pain Control:  Labor support without medications and Planning epidural when needed.  I/D:  n/Samantha Anticipated MOD:  NSVD  Thressa ShellerHeather Adia Warner 06/17/2017, 10:40 AM

## 2017-06-17 NOTE — Progress Notes (Signed)
Samantha Warner is a 21 y.o. G2P1001 at 6863w0d here for IOL for gestational hypertension.    Subjective: Patient is comfortable, she denies pain or cramping currently.   Objective: BP 134/66   Pulse 79   Temp 98.3 F (36.8 C) (Oral)   Resp 18   Ht 5\' 6"  (1.676 m)   Wt 85.3 kg (188 lb)   LMP 10/22/2016   BMI 30.34 kg/m    FHT:  FHR: 115 bpm, variability: moderate,  accelerations:  15x15,  decelerations: none UC:  irregular Dilation: 1.5 Effacement (%): 50 Cervical Position: Anterior Station: -3, -2 Exam by:: CNM   Labs: Results for orders placed or performed during the hospital encounter of 06/17/17 (from the past 24 hour(s))  CBC     Status: Abnormal   Collection Time: 06/17/17 12:41 AM  Result Value Ref Range   WBC 9.2 4.0 - 10.5 K/uL   RBC 4.12 3.87 - 5.11 MIL/uL   Hemoglobin 9.6 (L) 12.0 - 15.0 g/dL   HCT 19.130.4 (L) 47.836.0 - 29.546.0 %   MCV 73.8 (L) 78.0 - 100.0 fL   MCH 23.3 (L) 26.0 - 34.0 pg   MCHC 31.6 30.0 - 36.0 g/dL   RDW 62.114.3 30.811.5 - 65.715.5 %   Platelets 297 150 - 400 K/uL  Type and screen     Status: None   Collection Time: 06/17/17 12:41 AM  Result Value Ref Range   ABO/RH(D) O POS    Antibody Screen NEG    Sample Expiration 06/20/2017   Group B strep by PCR     Status: None   Collection Time: 06/17/17  1:11 AM  Result Value Ref Range   Group B strep by PCR NEGATIVE NEGATIVE    Assessment / Plan: Samantha Warner is a 21 y.o. G2P1001 at 963w0d here for IOL for gestational HTN.   Labor: Foley bulb placed at 6:50 am Fetal Wellbeing:  Category 1 Pain Control:  Epidural planned Anticipated MOD:  NSVD  Francie MassingBrown, Porfirio Bollier, Medical Student  06/17/2017 6:55 AM

## 2017-06-17 NOTE — Progress Notes (Signed)
Samantha Warner is a 21 y.o. G2P1001 at 4546w0d  admitted for induction of labor due to Hypertension.  Subjective:   Objective: BP (!) 145/83   Pulse 84   Temp 98.3 F (36.8 C) (Oral)   Resp 16   Ht 5\' 6"  (1.676 m)   Wt 188 lb (85.3 kg)   LMP 10/22/2016   BMI 30.34 kg/m  No intake/output data recorded. No intake/output data recorded.  FHT:  FHR: 125 bpm, variability: moderate,  accelerations:  Present,  decelerations:  Absent UC:   regular, every 15 minutes SVE:   Dilation: 4 Effacement (%): 60 Station: -2 Exam by:: Whole FoodsH Hogan CNM  Labs: Lab Results  Component Value Date   WBC 9.2 06/17/2017   HGB 9.6 (L) 06/17/2017   HCT 30.4 (L) 06/17/2017   MCV 73.8 (L) 06/17/2017   PLT 297 06/17/2017    Assessment / Plan: Latent phase, beginning to make some change. Cervix is more efffaced, and head applied. AROM now.   Labor: AROM Preeclampsia:  no severe features  Fetal Wellbeing:  Category I Pain Control:  Labor support without medications I/D:  n/a Anticipated MOD:  NSVD  Thressa ShellerHeather Hogan 06/17/2017, 7:02 PM

## 2017-06-17 NOTE — Anesthesia Pain Management Evaluation Note (Signed)
  CRNA Pain Management Visit Note  Patient: Samantha Warner, 21 y.o., female  "Hello I am a [facility] of the anesthesia [facility]. We have an anesthesia team available at all times to provide care throughout the hospital, including epidural management and anesthesia for C-section. I don't know your plan for the delivery whether it a natural birth, water birth, IV sedation, nitrous supplementation, doula or epidural, but we want to meet your pain goals."   1.Was your pain managed to your expectations on prior hospitalizations?   Yes   2.What is your expectation for pain management during this hospitalization?     Epidural  3.How can we help you reach that goal? Epidural   Record the patient's initial score and the patient's pain goal.   Pain: 6  Pain Goal: 10 [facility] wants you to be able to say your pain was always managed very well.  Rica RecordsICKELTON,Masayuki Sakai 06/17/2017

## 2017-06-17 NOTE — Progress Notes (Signed)
Called provided to discuss possibility of FSE due to inability to keep baby on monitor due to patient continuous movement in bed. Provider stated to reevaluate after epidural placement.

## 2017-06-17 NOTE — Anesthesia Preprocedure Evaluation (Signed)
Anesthesia Evaluation  Patient identified by MRN, date of birth, ID band Patient awake    Reviewed: Allergy & Precautions, H&P , Patient's Chart, lab work & pertinent test results  Airway Mallampati: II  TM Distance: >3 FB Neck ROM: full    Dental  (+) Teeth Intact   Pulmonary former smoker,    breath sounds clear to auscultation       Cardiovascular hypertension,  Rhythm:regular Rate:Normal     Neuro/Psych    GI/Hepatic   Endo/Other    Renal/GU      Musculoskeletal   Abdominal   Peds  Hematology   Anesthesia Other Findings       Reproductive/Obstetrics (+) Pregnancy                             Anesthesia Physical  Anesthesia Plan  ASA: II  Anesthesia Plan: Epidural   Post-op Pain Management:    Induction:   PONV Risk Score and Plan:   Airway Management Planned:   Additional Equipment:   Intra-op Plan:   Post-operative Plan:   Informed Consent: I have reviewed the patients History and Physical, chart, labs and discussed the procedure including the risks, benefits and alternatives for the proposed anesthesia with the patient or authorized representative who has indicated his/her understanding and acceptance.   Dental Advisory Given  Plan Discussed with:   Anesthesia Plan Comments: (Labs checked- platelets confirmed with RN in room. Fetal heart tracing, per RN, reported to be stable enough for sitting procedure. Discussed epidural, and patient consents to the procedure:  included risk of possible headache,backache, failed block, allergic reaction, and nerve injury. This patient was asked if she had any questions or concerns before the procedure started.)        Anesthesia Quick Evaluation  

## 2017-06-18 ENCOUNTER — Encounter (HOSPITAL_COMMUNITY): Payer: Self-pay

## 2017-06-18 MED ORDER — ONDANSETRON HCL 4 MG/2ML IJ SOLN
4.0000 mg | INTRAMUSCULAR | Status: DC | PRN
Start: 2017-06-18 — End: 2017-06-19

## 2017-06-18 MED ORDER — IBUPROFEN 600 MG PO TABS
600.0000 mg | ORAL_TABLET | Freq: Four times a day (QID) | ORAL | Status: DC
  Administered 2017-06-18 – 2017-06-19 (×6): 600 mg via ORAL
  Filled 2017-06-18 (×6): qty 1

## 2017-06-18 MED ORDER — SIMETHICONE 80 MG PO CHEW: 80.0000 mg | CHEWABLE_TABLET | ORAL | Status: DC | PRN

## 2017-06-18 MED ORDER — DIPHENHYDRAMINE HCL 25 MG PO CAPS: 25.0000 mg | ORAL_CAPSULE | Freq: Four times a day (QID) | ORAL | Status: DC | PRN

## 2017-06-18 MED ORDER — SENNOSIDES-DOCUSATE SODIUM 8.6-50 MG PO TABS
2.0000 | ORAL_TABLET | ORAL | Status: DC
  Administered 2017-06-18: 2 via ORAL
  Filled 2017-06-18: qty 2

## 2017-06-18 MED ORDER — ONDANSETRON HCL 4 MG PO TABS: 4.0000 mg | ORAL_TABLET | ORAL | Status: DC | PRN

## 2017-06-18 MED ORDER — WITCH HAZEL-GLYCERIN EX PADS: 1.0000 "application " | MEDICATED_PAD | CUTANEOUS | Status: DC | PRN

## 2017-06-18 MED ORDER — TETANUS-DIPHTH-ACELL PERTUSSIS 5-2.5-18.5 LF-MCG/0.5 IM SUSP: 0.5000 mL | Freq: Once | INTRAMUSCULAR | Status: DC

## 2017-06-18 MED ORDER — ZOLPIDEM TARTRATE 5 MG PO TABS: 5.0000 mg | ORAL_TABLET | Freq: Every evening | ORAL | Status: DC | PRN

## 2017-06-18 MED ORDER — BENZOCAINE-MENTHOL 20-0.5 % EX AERO: 1.0000 "application " | INHALATION_SPRAY | CUTANEOUS | Status: DC | PRN

## 2017-06-18 MED ORDER — ACETAMINOPHEN 325 MG PO TABS: 650.0000 mg | ORAL_TABLET | ORAL | Status: DC | PRN

## 2017-06-18 MED ORDER — DIBUCAINE 1 % RE OINT: 1.0000 "application " | TOPICAL_OINTMENT | RECTAL | Status: DC | PRN

## 2017-06-18 MED ORDER — PRENATAL MULTIVITAMIN CH
1.0000 | ORAL_TABLET | Freq: Every day | ORAL | Status: DC
  Administered 2017-06-18 – 2017-06-19 (×2): 1 via ORAL
  Filled 2017-06-18 (×2): qty 1

## 2017-06-18 MED ORDER — COCONUT OIL OIL: 1.0000 "application " | TOPICAL_OIL | Status: DC | PRN

## 2017-06-18 NOTE — Anesthesia Postprocedure Evaluation (Signed)
Anesthesia Post Note  Patient: Cyndee Brightlyynesha R Burkman  Procedure(s) Performed: AN AD HOC LABOR EPIDURAL     Patient location during evaluation: Mother Baby Anesthesia Type: Epidural Level of consciousness: awake, awake and alert and oriented Pain management: pain level controlled Vital Signs Assessment: post-procedure vital signs reviewed and stable Respiratory status: spontaneous breathing, nonlabored ventilation and respiratory function stable Cardiovascular status: stable Postop Assessment: no headache, no backache, patient able to bend at knees, no apparent nausea or vomiting and adequate PO intake Anesthetic complications: no    Last Vitals:  Vitals:   06/18/17 0626 06/18/17 1135  BP: (!) 128/49 137/64  Pulse: 92 87  Resp: 16 16  Temp: 37.4 C 36.9 C  SpO2:  99%    Last Pain:  Vitals:   06/18/17 1135  TempSrc: Oral  PainSc: 3    Pain Goal: Patients Stated Pain Goal: 0 (06/18/17 1135)               Edy Mcbane

## 2017-06-18 NOTE — Progress Notes (Signed)
POSTPARTUM PROGRESS NOTE  Post Partum Day 1 Subjective:  Samantha Warner is a 21 y.o. [id number] 4072w0d s/p SVD.  No acute events overnight.  Pt denies problems with ambulating, voiding or po intake.  She denies nausea or vomiting.  Pain is well controlled.  She has had flatus. She has not had bowel movement.  Lochia Minimal.   Objective: Blood pressure (!) 128/49, pulse 92, temperature 99.4 F (37.4 C), temperature source Oral, resp. rate 16, height 5\' 6"  (1.676 m), weight 85.3 kg (188 lb), last menstrual period 10/22/2016, SpO2 100 %, unknown if currently breastfeeding.  Physical Exam:  General: alert, cooperative and no distress Lochia:normal flow Chest: no respiratory distress Heart:regular rate, distal pulses intact Abdomen: soft, nontender,  Uterine Fundus: firm, appropriately tender DVT Evaluation: No calf swelling or tenderness Extremities: no edema  Recent Labs    06/17/17 0041 06/17/17 1929  HGB 9.6* 10.4*  HCT 30.4* 32.1*    Assessment/Plan:  ASSESSMENT: Samantha Warner is a 21 y.o. [id number] 4172w0d s/p SVD  Plan for discharge tomorrow   LOS: 1 day   Geovannie Vilar BlandDO 06/18/2017, 7:21 AM

## 2017-06-18 NOTE — Clinical Social Work Maternal (Signed)
  CLINICAL SOCIAL WORK MATERNAL/CHILD NOTE  Patient Details  Name: Samantha Warner MRN: [id number] Date of Birth: 15-Apr-1996  Date:  06/18/2017  Clinical Social Worker Initiating Note:  [place] Date/Time: Initiated:  06/18/17/1200     Child's Name:  Samantha Warner   Biological Parents:  Mother, Father   Need for Interpreter:  None   Reason for Referral:  Current Substance Use/Substance Use During Pregnancy    Address:  [address] [place] [address]    Phone number:  [phone] (home)     Additional phone number:  Household Members/Support Persons (HM/SP):   Household Member/Support Person 1   HM/SP Name Relationship DOB or Age  HM/SP -1   Boyfriend    HM/SP -2        HM/SP -3        HM/SP -4        HM/SP -5        HM/SP -6        HM/SP -7        HM/SP -8          Natural Supports (not living in the home):  Parent, Immediate Family   Professional Supports:     Employment: Unemployed   Type of Work:     Education:  Some College(Online enrollment now)   Homebound arranged:    Museum/gallery curator Resources:  Medicaid   Other Resources:      Cultural/Religious Considerations Which May Impact Care:  None at this time  Strengths:  Engineer, materials, Home prepared for child    Psychotropic Medications:         Pediatrician:    Solicitor area  Pediatrician List:   [facility] [place]      Pediatrician Fax Number:    Risk Factors/Current Problems:  Substance Use    Cognitive State:  Able to Concentrate , Linear Thinking    Mood/Affect:  Calm , Comfortable , Flat    CSW Assessment: Clinical Education officer, museum met with [name] and FOB at bedside to offer support.  [name] states that this is her second child but FOB's fifth child.  Currently in the home, [name], FOB, three boys under the age of 32 and now the baby.  [name] states that she is prepared for  baby to return home with family and has all necessary basic needs met.  CSW inquired about "occassional marijuana use" during pregnancy.  [name] states that it was to help with her morning sickness and she has not smoked in over 2 months.  [name] and FOB both aware of Cord Tissue Drug Screen and do not feel that there will be concern for positive results.  [name] aware of potential CPS involvement in [facility] if positive screen.    Clinical Social Worker will sign off for now as social work intervention is no longer needed. Please consult [place] again if new need arises.  CSW Plan/Description:  CSW Will Continue to Monitor Umbilical Cord Tissue Drug Screen Results and Make Report if [place], [place]

## 2017-06-19 MED ORDER — AMLODIPINE BESYLATE 5 MG PO TABS: 5.0000 mg | ORAL_TABLET | Freq: Every day | ORAL | 11 refills | Status: DC

## 2017-06-19 NOTE — Lactation Note (Signed)
This note was copied from a baby's chart. Lactation Consultation Note  Patient Name: Boy Rozann LeschesDynesha Lotito QIONG'EToday's Date: 06/19/2017 Reason for consult: Follow-up assessment;Early term 37-38.6wks;Other (Comment);Infant weight loss(5% weight loss. per mom plans to breast feed and pump some )  Baby is 37 hours old.  Baby has recently received EBM in a bottle 30 ml Per mom plans to feed both ways.  LC encouraged mom to give the baby  lost of practice latching at the breast.  Mom denies sore nipple . Sore nipple and engorgement prevention and tx reviewed.  Mom has a hand pump for D/C, and is active with WIC.  Mother informed of post-discharge support and given phone number to the lactation department, including services for phone call assistance; out-patient appointments; and breastfeeding support group. List of other breastfeeding resources in the community given in the handout. Encouraged mother to call for problems or concerns related to breastfeeding.    Maternal Data Has patient been taught Hand Expression?: (mom exp ) Does the patient have breastfeeding experience prior to this delivery?: Yes  Feeding Feeding Type: (baby recently was fed 30 ml of EBM )  LATCH Score                   Interventions Interventions: Breast feeding basics reviewed  Lactation Tools Discussed/Used Tools: Pump Breast pump type: Manual WIC Program: Yes Pump Review: Setup, frequency, and cleaning;Milk Storage   Consult Status Consult Status: Complete    Matilde SprangMargaret Ann Aziah Brostrom 06/19/2017, 12:27 PM

## 2017-06-19 NOTE — Discharge Summary (Signed)
OB Discharge Summary     Patient Name: Samantha Warner DOB: 1996/05/25 MRN: [id number]  Date of admission: 06/17/2017 Delivering MD: Francie MassingBROWN, MELISSA   Date of discharge: 06/19/2017  Admitting diagnosis: 37 WKS INDUCTION Intrauterine pregnancy: 5242w0d     Secondary diagnosis:  Active Problems:   Labor and delivery, indication for care   SVD (spontaneous vaginal delivery)  Additional problems: GHTN     Discharge diagnosis: Term Pregnancy Delivered                                                                                                Post partum procedures:n/a  Augmentation: Pitocin and Cytotec  Complications: None  Hospital course:  Induction of Labor With Vaginal Delivery   21 y.o. yo [id number] at 5142w0d was admitted to the hospital 06/17/2017 for induction of labor.  Indication for induction: Gestational hypertension.  Patient had an uncomplicated labor course as follows: Membrane Rupture Time/Date: 6:56 PM ,06/17/2017   Intrapartum Procedures: Episiotomy: None [1]                                         Lacerations:  None [1]  Patient had delivery of a Viable infant.  Information for the patient's newborn:  Vassie LollGause, Boy Sophiya [[id number]][[id number]]  Delivery Method: Vaginal, Spontaneous(Filed from Delivery Summary)   06/17/2017  Details of delivery can be found in separate delivery note.  Patient had a routine postpartum course. Patient is discharged home 06/19/17.  Physical exam  Vitals:   06/18/17 0626 06/18/17 1135 06/18/17 1826 06/19/17 0630  BP: (!) 128/49 137/64 139/83 137/66  Pulse: 92 87 81 69  Resp: 16 16 16 18   Temp: 99.4 F (37.4 C) 98.5 F (36.9 C) 98.3 F (36.8 C) 97.9 F (36.6 C)  TempSrc: Oral Oral Oral Oral  SpO2:  99% 100%   Weight:    82.6 kg (182 lb)  Height:       General: alert, cooperative and no distress Lochia: appropriate Uterine Fundus: firm Incision: N/A DVT Evaluation: No evidence of DVT seen on physical exam. Labs: Lab Results   Component Value Date   WBC 15.6 (H) 06/17/2017   HGB 10.4 (L) 06/17/2017   HCT 32.1 (L) 06/17/2017   MCV 73.6 (L) 06/17/2017   PLT 243 06/17/2017   CMP Latest Ref Rng & Units 06/17/2017  Glucose 65 - 99 mg/dL 93  BUN 6 - 20 mg/dL 6  Creatinine 1.300.44 - 8.651.00 mg/dL 7.840.46  Sodium 696135 - 295145 mmol/L 135  Potassium 3.5 - 5.1 mmol/L 3.9  Chloride 101 - 111 mmol/L 106  CO2 22 - 32 mmol/L 19(L)  Calcium 8.9 - 10.3 mg/dL 9.6  Total Protein 6.5 - 8.1 g/dL 6.8  Total Bilirubin 0.3 - 1.2 mg/dL 0.4  Alkaline Phos 38 - 126 U/L 156(H)  AST 15 - 41 U/L 28  ALT 14 - 54 U/L 20    Discharge instruction: per After Visit Summary and "Baby and Me Booklet".  After visit meds:  Allergies as of 06/19/2017      Reactions   Peanuts [peanut Oil] Anaphylaxis   Nickel Rash      Medication List    TAKE these medications   amLODipine 5 MG tablet Commonly known as:  NORVASC Take 1 tablet (5 mg total) by mouth daily.   aspirin EC 81 MG tablet Take 81 mg daily by mouth.   prenatal multivitamin Tabs tablet Take 1 tablet daily at 12 noon by mouth.   valACYclovir 500 MG tablet Commonly known as:  VALTREX Take 500 mg by mouth 2 (two) times daily.       Diet: routine diet  Activity: Advance as tolerated. Pelvic rest for 6 weeks.   Outpatient follow up:2 weeks, [facility] to help arrange HD appt Follow up Appt:No future appointments. Follow up Visit:No Follow-up on file.  Postpartum contraception: Nexplanon  Newborn Data: Live born female  Birth Weight: 7 lb 1.9 oz (3229 g) APGAR: 9, 9  Newborn Delivery   Birth date/time:  06/17/2017 22:55:00 Delivery type:  Vaginal, Spontaneous     Baby Feeding: Breast Disposition:home with mother   06/19/2017 Marthenia RollingScott Marcel Gary, DO

## 2017-06-26 ENCOUNTER — Ambulatory Visit: Payer: Medicaid Other

## 2017-10-18 ENCOUNTER — Encounter (HOSPITAL_COMMUNITY): Payer: Self-pay | Admitting: Emergency Medicine

## 2017-10-18 ENCOUNTER — Emergency Department (HOSPITAL_COMMUNITY): Payer: Medicaid Other

## 2017-10-18 ENCOUNTER — Other Ambulatory Visit: Payer: Self-pay

## 2017-10-18 ENCOUNTER — Emergency Department (HOSPITAL_COMMUNITY)
Admission: EM | Admit: 2017-10-18 | Discharge: 2017-10-18 | Disposition: A | Discharge status: Home / Self Care | Payer: Medicaid Other | Attending: Emergency Medicine | Admitting: Emergency Medicine

## 2017-10-18 DIAGNOSIS — R1013 Epigastric pain: Secondary | ICD-10-CM | POA: Diagnosis not present

## 2017-10-18 DIAGNOSIS — R109 Unspecified abdominal pain: Secondary | ICD-10-CM

## 2017-10-18 LAB — CBC
HCT: 36.2 % (ref 36.0–46.0)
Hemoglobin: 11.3 g/dL — ABNORMAL LOW (ref 12.0–15.0)
MCH: 22.2 pg — ABNORMAL LOW (ref 26.0–34.0)
MCHC: 31.2 g/dL (ref 30.0–36.0)
MCV: 71.1 fL — ABNORMAL LOW (ref 78.0–100.0)
Platelets: 300 10*3/uL (ref 150–400)
RBC: 5.09 MIL/uL (ref 3.87–5.11)
RDW: 14.6 % (ref 11.5–15.5)
WBC: 6.3 10*3/uL (ref 4.0–10.5)

## 2017-10-18 LAB — COMPREHENSIVE METABOLIC PANEL
ALT: 16 U/L (ref 14–54)
ANION GAP: 10 (ref 5–15)
AST: 17 U/L (ref 15–41)
Albumin: 4.4 g/dL (ref 3.5–5.0)
Alkaline Phosphatase: 60 U/L (ref 38–126)
BUN: 10 mg/dL (ref 6–20)
CHLORIDE: 106 mmol/L (ref 101–111)
CO2: 21 mmol/L — AB (ref 22–32)
Calcium: 9.2 mg/dL (ref 8.9–10.3)
Creatinine, Ser: 0.75 mg/dL (ref 0.44–1.00)
GFR calc Af Amer: >60 mL/min (ref >60)
GFR calc non Af Amer: >60 mL/min (ref >60)
GLUCOSE: 98 mg/dL (ref 65–99)
POTASSIUM: 3.7 mmol/L (ref 3.5–5.1)
SODIUM: 137 mmol/L (ref 135–145)
Total Bilirubin: 0.4 mg/dL (ref 0.3–1.2)
Total Protein: 7.6 g/dL (ref 6.5–8.1)

## 2017-10-18 LAB — I-STAT BETA HCG BLOOD, ED (MC, WL, AP ONLY): I-stat hCG, quantitative: <5 m[IU]/mL (ref <5)

## 2017-10-18 LAB — LIPASE, BLOOD: Lipase: 24 U/L (ref 11–51)

## 2017-10-18 MED ORDER — RANITIDINE HCL 150 MG PO CAPS: 150.0000 mg | ORAL_CAPSULE | Freq: Every day | ORAL | 0 refills | Status: DC

## 2017-10-18 MED ORDER — ONDANSETRON 4 MG PO TBDP
8.0000 mg | ORAL_TABLET | Freq: Once | ORAL | Status: AC
  Administered 2017-10-18: 8 mg via ORAL
  Filled 2017-10-18: qty 2

## 2017-10-18 MED ORDER — ONDANSETRON HCL 4 MG PO TABS: 4.0000 mg | ORAL_TABLET | Freq: Four times a day (QID) | ORAL | 0 refills | Status: DC

## 2017-10-18 NOTE — ED Notes (Signed)
Patient verbalizes understanding of discharge instructions. Opportunity for questioning and answers were provided. Armband removed by staff, pt discharged from ED ambulatory.   

## 2017-10-18 NOTE — ED Triage Notes (Signed)
C/o mid upper abd pain since this morning with nausea.  Reports LMP 2 months ago.

## 2017-10-18 NOTE — Discharge Instructions (Addendum)
Please read attached information. If you experience any new or worsening signs or symptoms please return to the emergency room for evaluation. Please follow-up with your primary care provider or specialist as discussed. Please use medication prescribed only as directed and discontinue taking if you have any concerning signs or symptoms.   °

## 2017-10-18 NOTE — ED Provider Notes (Signed)
Patient placed in Quick Look pathway, seen and evaluated   Chief Complaint: upper abdominal pain, nausea, vomiting  HPI:   Pain started this morning, epigastric, does not radiate, sharp. Reports associated nausea. No diarrhea. Last menstrual cycle 2 weeks ago. 4 months post partum, on depo, one period since then. Pt thinks she may be pregnant. Normal bowel movements. No urinary symptoms. No other complaints.   ROS: positive for abdominal pain, nausea. Negative vomiting, diarrhea, urinary symptoms.   Physical Exam:   Gen: No distress  Neuro: Awake and Alert  Skin: Warm    Focused Exam:  Epigastric tenderness, no guarding. No RUQ tenderness. No cva tenderness.   Pt with epigastric pain and nausea. Wants a pregnancy test. 4 months post partum. Will get labs and RUQ us.    Initiation of care has begun. The patient has been counseled on the process, plan, and necessity for staying for the completion/evaluation, and the remainder of the medical screening examination     Jaynie CrumbleKirichenko, Lisa-Marie Rueger, PA-C 10/18/17 1934    Melene PlanFloyd, Dan, DO 10/18/17 2236

## 2017-10-18 NOTE — ED Notes (Signed)
Patient changing into gown 

## 2017-10-21 ENCOUNTER — Ambulatory Visit (HOSPITAL_COMMUNITY)
Admission: EM | Admit: 2017-10-21 | Discharge: 2017-10-21 | Disposition: A | Discharge status: Home / Self Care | Payer: Medicaid Other | Attending: Family Medicine | Admitting: Family Medicine

## 2017-10-21 ENCOUNTER — Encounter (HOSPITAL_COMMUNITY): Payer: Self-pay | Admitting: *Deleted

## 2017-10-21 ENCOUNTER — Other Ambulatory Visit: Payer: Self-pay

## 2017-10-21 DIAGNOSIS — R1084 Generalized abdominal pain: Secondary | ICD-10-CM

## 2017-10-21 DIAGNOSIS — R109 Unspecified abdominal pain: Secondary | ICD-10-CM | POA: Diagnosis present

## 2017-10-21 LAB — POCT URINALYSIS DIP (DEVICE)
Bilirubin Urine: NEGATIVE
Glucose, UA: NEGATIVE mg/dL
Ketones, ur: NEGATIVE mg/dL
Nitrite: NEGATIVE
Protein, ur: NEGATIVE mg/dL
Specific Gravity, Urine: 1.015 (ref 1.005–1.030)
Urobilinogen, UA: 0.2 mg/dL (ref 0.0–1.0)
pH: 7 (ref 5.0–8.0)

## 2017-10-21 LAB — POCT PREGNANCY, URINE: Preg Test, Ur: NEGATIVE

## 2017-10-21 NOTE — ED Triage Notes (Signed)
C/O intermittent mid-abd pain x approx 1 wk with nausea and diarrhea.  Denies vomiting or fevers.

## 2017-10-21 NOTE — ED Provider Notes (Signed)
[facility]    CSN: [id number] Arrival date & time: 10/21/17  1545     History   Chief Complaint Chief Complaint  Patient presents with  . Abdominal Pain    HPI Samantha Warner is a 22 y.o. female.   22 yo female here for a work note. She was seen in ED 3 days ago for abdominal pain and was diagnosed with a virus. Her nausea is resolved and abdominal pain improved. She would like a work note today. No other complaints or requests.      Past Medical History:  Diagnosis Date  . Anemia   . Herpes   . Pregnancy induced hypertension   . UTI (urinary tract infection)     Patient Active Problem List   Diagnosis Date Noted  . Labor and delivery, indication for care 06/17/2017  . SVD (spontaneous vaginal delivery) 06/17/2017  . Gestational hypertension 10/02/2014  . Encounter for fetal anatomic survey   . [redacted] weeks gestation of pregnancy     Past Surgical History:  Procedure Laterality Date  . NO PAST SURGERIES      OB History    Gravida  2   Para  2   Term  2   Preterm  0   AB  0   Living  2     SAB  0   TAB  0   Ectopic  0   Multiple  0   Live Births  2            Home Medications    Prior to Admission medications   Medication Sig Start Date End Date Taking? Authorizing Provider  ondansetron (ZOFRAN) 4 MG tablet Take 1 tablet (4 mg total) by mouth every 6 (six) hours. 10/18/17  Yes Hedges, Tinnie Gens, PA-C  ranitidine (ZANTAC) 150 MG capsule Take 1 capsule (150 mg total) by mouth daily. 10/18/17  Yes Hedges, Tinnie Gens, PA-C  amLODipine (NORVASC) 5 MG tablet Take 1 tablet (5 mg total) by mouth daily. 06/19/17 06/19/18  Marthenia Rolling, DO  aspirin EC 81 MG tablet Take 81 mg daily by mouth.    [provider]  Prenatal Vit-Fe Fumarate-FA (PRENATAL MULTIVITAMIN) TABS tablet Take 1 tablet daily at 12 noon by mouth.    [provider]  valACYclovir (VALTREX) 500 MG tablet Take 500 mg by mouth 2 (two) times daily.  05/31/17    [provider]    Family History History reviewed. No pertinent family history.  Social History Social History   Tobacco Use  . Smoking status: Current Every Day Smoker    Types: Cigarettes    Last attempt to quit: 10/14/2016    Years since quitting: 1.0  . Smokeless tobacco: Never Used  Substance Use Topics  . Alcohol use: Yes    Comment: occasionally  . Drug use: Yes    Types: Marijuana, Cocaine    Comment: Denies current cocaine use     Allergies   Peanuts [peanut oil] and Nickel   Review of Systems Review of Systems  Constitutional: Negative for activity change and appetite change.     Physical Exam Triage Vital Signs ED Triage Vitals  Enc Vitals Group     BP 10/21/17 1607 105/68     Pulse Rate 10/21/17 1607 83     Resp 10/21/17 1607 16     Temp 10/21/17 1607 98.9 F (37.2 C)     Temp Source 10/21/17 1607 Oral     SpO2 10/21/17 1607  96 %     Weight --      Height --      Head Circumference --      Peak Flow --      Pain Score 10/21/17 1608 4     Pain Loc --      Pain Edu? --      Excl. in GC? --    No data found.  Updated Vital Signs BP 105/68   Pulse 83   Temp 98.9 F (37.2 C) (Oral)   Resp 16   SpO2 96%   Breastfeeding? No   Visual Acuity Right Eye Distance:   Left Eye Distance:   Bilateral Distance:    Right Eye Near:   Left Eye Near:    Bilateral Near:     Physical Exam  Constitutional: She appears well-developed and well-nourished.  HENT:  Head: Normocephalic and atraumatic.     UC Treatments / Results  Labs (all labs ordered are listed, but only abnormal results are displayed) Labs Reviewed  POCT URINALYSIS DIP (DEVICE) - Abnormal; Notable for the following components:      Result Value   Hgb urine dipstick TRACE (*)    Leukocytes, UA LARGE (*)    All other components within normal limits  POCT PREGNANCY, URINE    EKG None Radiology No results found.  Procedures Procedures (including critical care  time)  Medications Ordered in UC Medications - No data to display   Initial Impression / Assessment and Plan / UC Course  I have reviewed the triage vital signs and the nursing notes.  Pertinent labs & imaging results that were available during my care of the patient were reviewed by me and considered in my medical decision making (see chart for details).     1. Abdominal pain- improved. Will give work note.  Final Clinical Impressions(s) / UC Diagnoses   Final diagnoses:  None    ED Discharge Orders    None       Controlled Substance Prescriptions [place] Controlled Substance Registry consulted? Not Applicable   Rolm BookbinderMoss, Nelva Hauk, DO 10/21/17 1644

## 2017-10-23 LAB — URINE CULTURE

## 2017-11-04 ENCOUNTER — Ambulatory Visit (HOSPITAL_COMMUNITY)
Admission: EM | Admit: 2017-11-04 | Discharge: 2017-11-04 | Disposition: A | Discharge status: Home / Self Care | Payer: Medicaid Other | Attending: Family Medicine | Admitting: Family Medicine

## 2017-11-04 ENCOUNTER — Encounter (HOSPITAL_COMMUNITY): Payer: Self-pay | Admitting: Emergency Medicine

## 2017-11-04 DIAGNOSIS — M545 Low back pain, unspecified: Secondary | ICD-10-CM

## 2017-11-04 DIAGNOSIS — M62838 Other muscle spasm: Secondary | ICD-10-CM

## 2017-11-04 DIAGNOSIS — M62830 Muscle spasm of back: Secondary | ICD-10-CM | POA: Diagnosis not present

## 2017-11-04 MED ORDER — NAPROXEN 375 MG PO TABS: 375.0000 mg | ORAL_TABLET | Freq: Two times a day (BID) | ORAL | 0 refills | Status: DC

## 2017-11-04 NOTE — Discharge Instructions (Signed)
Rest, ice, heat and gentle stretches Prescribed naproxen as needed for pain Given work note Return or follow up with PCP if symptoms persists Return or go to ER if you have any new or worsening symptoms abdominal pain, nausea, vomiting, shortness of breath, decreased sensation, weakness, loss of bowel or bladder function, numbness or tingling etc..Marland Kitchen

## 2017-11-04 NOTE — ED Provider Notes (Signed)
[facility]    CSN: [id number] Arrival date & time: 11/04/17  1454     History   Chief Complaint Chief Complaint  Patient presents with  . Hip Pain    HPI Samantha Warner is a 22 y.o. female.   Complains of left low back that started three days ago that is improving.  It began after she tripped and fell backward onto soft ottoman.  She localizes the pain to the left lower back.  She has tried OTC medications with temporary relief.  Her symptoms are made worse with walking and standing from a lying position.  She denies having similar symptoms in the past.  She denies erythema, ecchymosis, swelling, numbness or tingling.       Past Medical History:  Diagnosis Date  . Anemia   . Herpes   . Pregnancy induced hypertension   . UTI (urinary tract infection)     Patient Active Problem List   Diagnosis Date Noted  . Labor and delivery, indication for care 06/17/2017  . SVD (spontaneous vaginal delivery) 06/17/2017  . Gestational hypertension 10/02/2014  . Encounter for fetal anatomic survey   . [redacted] weeks gestation of pregnancy     Past Surgical History:  Procedure Laterality Date  . NO PAST SURGERIES      OB History    Gravida  2   Para  2   Term  2   Preterm  0   AB  0   Living  2     SAB  0   TAB  0   Ectopic  0   Multiple  0   Live Births  2            Home Medications    Prior to Admission medications   Medication Sig Start Date End Date Taking? Authorizing Provider  amLODipine (NORVASC) 5 MG tablet Take 1 tablet (5 mg total) by mouth daily. 06/19/17 06/19/18  Marthenia Rolling, DO  aspirin EC 81 MG tablet Take 81 mg daily by mouth.    [provider]  naproxen (NAPROSYN) 375 MG tablet Take 1 tablet (375 mg total) by mouth 2 (two) times daily. 11/04/17   Tejasvi Brissett, [place], PA-C  ondansetron (ZOFRAN) 4 MG tablet Take 1 tablet (4 mg total) by mouth every 6 (six) hours. 10/18/17   Hedges, Tinnie Gens, PA-C  Prenatal Vit-Fe Fumarate-FA  (PRENATAL MULTIVITAMIN) TABS tablet Take 1 tablet daily at 12 noon by mouth.    [provider]  ranitidine (ZANTAC) 150 MG capsule Take 1 capsule (150 mg total) by mouth daily. 10/18/17   Hedges, Tinnie Gens, PA-C  valACYclovir (VALTREX) 500 MG tablet Take 500 mg by mouth 2 (two) times daily.  05/31/17   [provider]    Family History History reviewed. No pertinent family history.  Social History Social History   Tobacco Use  . Smoking status: Current Every Day Smoker    Types: Cigarettes    Last attempt to quit: 10/14/2016    Years since quitting: 1.0  . Smokeless tobacco: Never Used  Substance Use Topics  . Alcohol use: Yes    Comment: occasionally  . Drug use: Yes    Types: Marijuana, Cocaine    Comment: Denies current cocaine use     Allergies   Peanuts [peanut oil] and Nickel   Review of Systems Review of Systems  Constitutional: Negative for chills and fever.  Respiratory: Negative for shortness of breath.   Cardiovascular: Negative for chest pain.  Gastrointestinal: Negative for nausea and vomiting.  Musculoskeletal: Positive for back pain. Negative for arthralgias, gait problem, joint swelling and myalgias.  Skin: Negative for color change.     Physical Exam Triage Vital Signs ED Triage Vitals [11/04/17 1510]  Enc Vitals Group     BP 123/75     Pulse Rate 96     Resp 18     Temp 99.4 F (37.4 C)     Temp Source Oral     SpO2 96 %     Weight      Height      Head Circumference      Peak Flow      Pain Score      Pain Loc      Pain Edu?      Excl. in GC?    No data found.  Updated Vital Signs BP 123/75 (BP Location: Right Arm)   Pulse 96   Temp 99.4 F (37.4 C) (Oral)   Resp 18   SpO2 96%   Physical Exam  Constitutional: She is oriented to person, place, and time. She appears well-developed and well-nourished. No distress.  HENT:  Head: Normocephalic and atraumatic.  Right Ear: External ear normal.  Left Ear: External  ear normal.  Nose: Nose normal.  Eyes: EOM are normal. No scleral icterus.  Neck: Normal range of motion.  Cardiovascular: Normal rate, regular rhythm and normal heart sounds. Exam reveals no friction rub.  No murmur heard. Radial pulse 2+ bilaterally    Pulmonary/Chest: Effort normal and breath sounds normal. No respiratory distress. She has no wheezes. She has no rales.  Musculoskeletal:  Back:  Patient ambulates from chair to exam table without difficulty.  Inspection: Skin clear and intact without obvious swelling, erythema, or ecchymosis. Warm to the touch  Palpation: Vertebral processes nontender. Mild tenderness about the lower left paravertebral muscles  ROM: FROM Strength: 5/5 hip flexion, 5/5 knee extension, 5/5 knee flexion, 5/5 plantar flexion, 5/5 dorsiflexion  DTR: Patellar tendon reflex intact  Special Tests: Negative Straight leg raise   Neurological: She is alert and oriented to person, place, and time.  Skin: Skin is warm and dry. Capillary refill takes less than 2 seconds. She is not diaphoretic.  Psychiatric: She has a normal mood and affect. Her behavior is normal. Judgment and thought content normal.  Nursing note and vitals reviewed.    UC Treatments / Results  Labs (all labs ordered are listed, but only abnormal results are displayed) Labs Reviewed - No data to display  EKG None Radiology No results found.  Procedures Procedures (including critical care time)  Medications Ordered in UC Medications - No data to display   Initial Impression / Assessment and Plan / UC Course  I have reviewed the triage vital signs and the nursing notes.  Pertinent labs & imaging results that were available during my care of the patient were reviewed by me and considered in my medical decision making (see chart for details).     Complaining of left sided low back pain x2 days that is improving after falling backwards over ottoman.  PE consistent with back spasm.   Prescribed naproxen.  Instructed patient to rest, ice, heat and gentle stretches.  Will return or follow up with PCP if symptoms persists.  New or worsening symptoms will return or go to ER.    Final Clinical Impressions(s) / UC Diagnoses   Final diagnoses:  Acute left-sided low back pain without sciatica  Muscle  spasm    ED Discharge Orders        Ordered    naproxen (NAPROSYN) 375 MG tablet  2 times daily     11/04/17 1539       Controlled Substance Prescriptions [place] Controlled Substance Registry consulted? No   Rennis Harding, [place] 11/04/17 1551

## 2017-11-04 NOTE — ED Triage Notes (Signed)
Pt here for left hip pain worse with movement

## 2017-11-19 ENCOUNTER — Encounter (HOSPITAL_COMMUNITY): Payer: Self-pay | Admitting: Emergency Medicine

## 2017-11-19 ENCOUNTER — Ambulatory Visit (HOSPITAL_COMMUNITY)
Admission: EM | Admit: 2017-11-19 | Discharge: 2017-11-19 | Disposition: A | Discharge status: Home / Self Care | Payer: Medicaid Other | Attending: Internal Medicine | Admitting: Internal Medicine

## 2017-11-19 DIAGNOSIS — H0289 Other specified disorders of eyelid: Secondary | ICD-10-CM | POA: Diagnosis not present

## 2017-11-19 NOTE — ED Provider Notes (Signed)
[facility]    CSN: [id number] Arrival date & time: 11/19/17  1652     History   Chief Complaint Chief Complaint  Patient presents with  . Eye Pain    HPI Samantha Warner is a 22 y.o. female.   22 year old female comes for right eyelid irritation and a work note.  Patient had a stye removed by ophthalmology 1 week ago, she followed up with them 2 days ago for postop check without problems.  States further in the afternoon, started having right eye irritation and therefore missed work.  She came in for a work note to be excused for missing work.  States area at that the stye was located is irritated.  Denies swelling, erythema, increased warmth.  She has fake eyelashes on, that she applied on own.  States adhesive is something she is used in the past without problems or irritation.  Denies fever, chills, night sweats.  Denies vision changes, photophobia, eye drainage.  She has been doing warm compress with some improvement of the irritation.     Past Medical History:  Diagnosis Date  . Anemia   . Herpes   . Pregnancy induced hypertension   . UTI (urinary tract infection)     Patient Active Problem List   Diagnosis Date Noted  . Labor and delivery, indication for care 06/17/2017  . SVD (spontaneous vaginal delivery) 06/17/2017  . Gestational hypertension 10/02/2014  . Encounter for fetal anatomic survey   . [redacted] weeks gestation of pregnancy     Past Surgical History:  Procedure Laterality Date  . NO PAST SURGERIES      OB History    Gravida  2   Para  2   Term  2   Preterm  0   AB  0   Living  2     SAB  0   TAB  0   Ectopic  0   Multiple  0   Live Births  2            Home Medications    Prior to Admission medications   Medication Sig Start Date End Date Taking? Authorizing Provider  amLODipine (NORVASC) 5 MG tablet Take 1 tablet (5 mg total) by mouth daily. 06/19/17 06/19/18  Marthenia Rolling, DO  aspirin EC 81 MG tablet Take 81 mg  daily by mouth.    [provider]  naproxen (NAPROSYN) 375 MG tablet Take 1 tablet (375 mg total) by mouth 2 (two) times daily. 11/04/17   Wurst, [place], PA-C  ondansetron (ZOFRAN) 4 MG tablet Take 1 tablet (4 mg total) by mouth every 6 (six) hours. 10/18/17   Hedges, Tinnie Gens, PA-C  Prenatal Vit-Fe Fumarate-FA (PRENATAL MULTIVITAMIN) TABS tablet Take 1 tablet daily at 12 noon by mouth.    [provider]  ranitidine (ZANTAC) 150 MG capsule Take 1 capsule (150 mg total) by mouth daily. 10/18/17   Hedges, Tinnie Gens, PA-C  valACYclovir (VALTREX) 500 MG tablet Take 500 mg by mouth 2 (two) times daily.  05/31/17   [provider]    Family History History reviewed. No pertinent family history.  Social History Social History   Tobacco Use  . Smoking status: Current Every Day Smoker    Types: Cigarettes    Last attempt to quit: 10/14/2016    Years since quitting: 1.0  . Smokeless tobacco: Never Used  Substance Use Topics  . Alcohol use: Yes    Comment: occasionally  . Drug use: Yes  Types: Marijuana, Cocaine    Comment: Denies current cocaine use     Allergies   Peanuts [peanut oil] and Nickel   Review of Systems Review of Systems  Reason unable to perform ROS: See HPI as above.     Physical Exam Triage Vital Signs ED Triage Vitals [11/19/17 1727]  Enc Vitals Group     BP 115/75     Pulse Rate 71     Resp 16     Temp 99.5 F (37.5 C)     Temp Source Oral     SpO2 100 %     Weight      Height      Head Circumference      Peak Flow      Pain Score      Pain Loc      Pain Edu?      Excl. in GC?    No data found.  Updated Vital Signs BP 115/75 (BP Location: Left Arm)   Pulse 71   Temp 99.5 F (37.5 C) (Oral)   Resp 16   SpO2 100%   Physical Exam  Constitutional: She is oriented to person, place, and time. She appears well-developed and well-nourished. No distress.  HENT:  Head: Normocephalic and atraumatic.  Eyes: Pupils are  equal, round, and reactive to light. Conjunctivae and EOM are normal.  Right eyelid with slight swelling where hordeolum was. No erythema, increased warmth. No tenderness on palpation. Partially covered by fake eyelashes.   Neurological: She is alert and oriented to person, place, and time.    UC Treatments / Results  Labs (all labs ordered are listed, but only abnormal results are displayed) Labs Reviewed - No data to display  EKG None  Radiology No results found.  Procedures Procedures (including critical care time)  Medications Ordered in UC Medications - No data to display  Initial Impression / Assessment and Plan / UC Course  I have reviewed the triage vital signs and the nursing notes.  Pertinent labs & imaging results that were available during my care of the patient were reviewed by me and considered in my medical decision making (see chart for details).    No signs of infection.  Patient to continue warm compress and to call ophthalmology, tomorrow if symptoms are not improving.  Discussed possible irritation from fake eyelash application.  Return precautions given.  Patient expresses understanding and agrees to plan.  Final Clinical Impressions(s) / UC Diagnoses   Final diagnoses:  Irritation of eyelid    ED Prescriptions    None        Belinda Fisher, PA-C 11/19/17 1805

## 2017-11-19 NOTE — Discharge Instructions (Signed)
No signs of infection. Continue warm compress, please call your eye doctor tomorrow to see if your fake eyelashes can contribute to the symptoms. If noticing spreading redness, swelling, increased warmth, please also call your eye doctor to move up the appointment time. Otherwise, follow up with your eye doctor as scheduled.

## 2017-11-19 NOTE — ED Triage Notes (Signed)
Pt here for work note after having stye lanced on right eye lid and having irritation

## 2017-12-06 ENCOUNTER — Ambulatory Visit (HOSPITAL_COMMUNITY)
Admission: EM | Admit: 2017-12-06 | Discharge: 2017-12-06 | Disposition: A | Discharge status: Home / Self Care | Payer: Medicaid Other | Attending: Family Medicine | Admitting: Family Medicine

## 2017-12-06 ENCOUNTER — Encounter (HOSPITAL_COMMUNITY): Payer: Self-pay | Admitting: Emergency Medicine

## 2017-12-06 ENCOUNTER — Other Ambulatory Visit: Payer: Self-pay

## 2017-12-06 DIAGNOSIS — R197 Diarrhea, unspecified: Secondary | ICD-10-CM

## 2017-12-06 LAB — POCT URINALYSIS DIP (DEVICE)
Bilirubin Urine: NEGATIVE
Glucose, UA: NEGATIVE mg/dL
HGB URINE DIPSTICK: NEGATIVE
KETONES UR: NEGATIVE mg/dL
Leukocytes, UA: NEGATIVE
Nitrite: NEGATIVE
PROTEIN: NEGATIVE mg/dL
SPECIFIC GRAVITY, URINE: 1.025 (ref 1.005–1.030)
UROBILINOGEN UA: 1 mg/dL (ref 0.0–1.0)
pH: 6 (ref 5.0–8.0)

## 2017-12-06 LAB — POCT PREGNANCY, URINE: Preg Test, Ur: NEGATIVE

## 2017-12-06 NOTE — ED Triage Notes (Signed)
Onset yesterday morning of abdominal pain, middle portion of abdomen.  Complains of nausea, no vomiting.  Patient has had diarrhea.

## 2017-12-06 NOTE — Discharge Instructions (Signed)
No alarming signs on exam. Keep hydrated, you urine should be clear to pale yellow in color. Bland diet as attached, advance as tolerated. Monitor for any worsening of symptoms, returned nausea/vomiting, worsening abdominal pain, fever, follow-up for reevaluation.

## 2017-12-06 NOTE — ED Provider Notes (Signed)
[facility]    CSN: [id number] Arrival date & time: 12/06/17  1009     History   Chief Complaint Chief Complaint  Patient presents with  . Abdominal Pain    HPI Samantha Warner is a 22 y.o. female.   22 year old female comes in for 1 day history of nausea, diarrhea.  States starting yesterday morning, had periumbilical pain that was intermittent, sharp/cramping pain at that time.  Pain has since resolved, and denies current abdominal pain.  She had nausea without vomiting that has now resolved as well, denies current nausea.  She has 3 episodes of loose stools, last episode this morning.  States she had some blood at wiping, but she associates that with initial straining prior to loose stools.  Denies melena, hematochezia.  She has not eaten today, but has been drinking fluids without problems.  Denies fever, chills, night sweats.  States partner has similar symptoms after sharing food with her.  States she did not go to work yesterday due to symptoms, but work is requiring work note. LMP 11/08/2017     Past Medical History:  Diagnosis Date  . Anemia   . Herpes   . Pregnancy induced hypertension   . UTI (urinary tract infection)     Patient Active Problem List   Diagnosis Date Noted  . Labor and delivery, indication for care 06/17/2017  . SVD (spontaneous vaginal delivery) 06/17/2017  . Gestational hypertension 10/02/2014  . Encounter for fetal anatomic survey   . [redacted] weeks gestation of pregnancy     Past Surgical History:  Procedure Laterality Date  . NO PAST SURGERIES      OB History    Gravida  2   Para  2   Term  2   Preterm  0   AB  0   Living  2     SAB  0   TAB  0   Ectopic  0   Multiple  0   Live Births  2            Home Medications    Prior to Admission medications   Medication Sig Start Date End Date Taking? Authorizing Provider  aspirin EC 81 MG tablet Take 81 mg daily by mouth.   Yes [provider]     Family History Family History  Problem Relation Age of Onset  . Diabetes Maternal Grandmother   . Hypertension Maternal Grandmother     Social History Social History   Tobacco Use  . Smoking status: Current Every Day Smoker    Types: Cigarettes    Last attempt to quit: 10/14/2016    Years since quitting: 1.1  . Smokeless tobacco: Never Used  Substance Use Topics  . Alcohol use: Yes    Comment: occasionally  . Drug use: Yes    Types: Marijuana, Cocaine    Comment: Denies current cocaine use     Allergies   Peanuts [peanut oil] and Nickel   Review of Systems Review of Systems  Reason unable to perform ROS: See HPI as above.     Physical Exam Triage Vital Signs ED Triage Vitals  Enc Vitals Group     BP 12/06/17 1051 133/89     Pulse Rate 12/06/17 1051 63     Resp 12/06/17 1051 18     Temp 12/06/17 1051 98.9 F (37.2 C)     Temp Source 12/06/17 1051 Oral     SpO2 12/06/17 1051 97 %  Weight --      Height --      Head Circumference --      Peak Flow --      Pain Score 12/06/17 1048 0     Pain Loc --      Pain Edu? --      Excl. in GC? --    No data found.  Updated Vital Signs BP 133/89 (BP Location: Left Arm)   Pulse 63   Temp 98.9 F (37.2 C) (Oral)   Resp 18   SpO2 97%   Physical Exam  Constitutional: She is oriented to person, place, and time. She appears well-developed and well-nourished. No distress.  HENT:  Head: Normocephalic and atraumatic.  Eyes: Pupils are equal, round, and reactive to light. Conjunctivae are normal.  Cardiovascular: Normal rate, regular rhythm and normal heart sounds. Exam reveals no gallop and no friction rub.  No murmur heard. Pulmonary/Chest: Effort normal and breath sounds normal. She has no wheezes. She has no rales.  Abdominal: Soft. Bowel sounds are normal. She exhibits no mass. There is no tenderness. There is no rigidity, no rebound, no guarding and no CVA tenderness.  Neurological: She is alert and  oriented to person, place, and time.  Skin: Skin is warm and dry.  Psychiatric: She has a normal mood and affect. Her behavior is normal. Judgment normal.     UC Treatments / Results  Labs (all labs ordered are listed, but only abnormal results are displayed) Labs Reviewed  POCT URINALYSIS DIP (DEVICE)  POCT PREGNANCY, URINE    EKG None  Radiology No results found.  Procedures Procedures (including critical care time)  Medications Ordered in UC Medications - No data to display  Initial Impression / Assessment and Plan / UC Course  I have reviewed the triage vital signs and the nursing notes.  Pertinent labs & imaging results that were available during my care of the patient were reviewed by me and considered in my medical decision making (see chart for details).    Discussed with patient no alarming signs on exam. Push fluids. Bland diet, advance as tolerated. Return precautions given.  Final Clinical Impressions(s) / UC Diagnoses   Final diagnoses:  Diarrhea, unspecified type    ED Prescriptions    None        Belinda Fisher, PA-C 12/06/17 1125

## 2017-12-20 ENCOUNTER — Encounter (HOSPITAL_COMMUNITY): Payer: Self-pay | Admitting: Emergency Medicine

## 2017-12-20 ENCOUNTER — Ambulatory Visit (HOSPITAL_COMMUNITY)
Admission: EM | Admit: 2017-12-20 | Discharge: 2017-12-20 | Disposition: A | Discharge status: Home / Self Care | Payer: Medicaid Other | Attending: Family Medicine | Admitting: Family Medicine

## 2017-12-20 ENCOUNTER — Other Ambulatory Visit: Payer: Self-pay

## 2017-12-20 DIAGNOSIS — R1084 Generalized abdominal pain: Secondary | ICD-10-CM | POA: Diagnosis not present

## 2017-12-20 DIAGNOSIS — Z3202 Encounter for pregnancy test, result negative: Secondary | ICD-10-CM

## 2017-12-20 DIAGNOSIS — R109 Unspecified abdominal pain: Secondary | ICD-10-CM

## 2017-12-20 LAB — POCT URINALYSIS DIP (DEVICE)
Bilirubin Urine: NEGATIVE
GLUCOSE, UA: NEGATIVE mg/dL
Ketones, ur: NEGATIVE mg/dL
Leukocytes, UA: NEGATIVE
Nitrite: NEGATIVE
Protein, ur: NEGATIVE mg/dL
UROBILINOGEN UA: 1 mg/dL (ref 0.0–1.0)
pH: 6 (ref 5.0–8.0)

## 2017-12-20 LAB — POCT PREGNANCY, URINE: Preg Test, Ur: NEGATIVE

## 2017-12-20 NOTE — ED Provider Notes (Signed)
[facility]    CSN: [id number] Arrival date & time: 12/20/17  1119     History   Chief Complaint Chief Complaint  Patient presents with  . Abdominal Pain    HPI Samantha Warner is a 22 y.o. female.    Patient states that she have had this generalized abdominal pain for 4 weeks or maybe longer. It was a gradual onset. She also has nausea and headache. She states that she is new to Depo Provera and started Depo 6 months ago, have had 2 injections since starting Depo.    The history is provided by the patient.  Abdominal Pain  Pain location:  Generalized Pain quality: aching and cramping   Pain radiates to:  Does not radiate Pain severity now: varies. Onset quality:  Gradual Duration:  4 weeks Timing:  Intermittent Progression:  Unchanged Chronicity:  New Worsened by:  Nothing Ineffective treatments:  NSAIDs Associated symptoms: nausea   Associated symptoms: no chest pain, no chills, no constipation, no diarrhea, no dysuria, no fever, no melena, no shortness of breath, no vaginal bleeding and no vaginal discharge     Past Medical History:  Diagnosis Date  . Anemia   . Herpes   . Pregnancy induced hypertension   . UTI (urinary tract infection)     Patient Active Problem List   Diagnosis Date Noted  . Labor and delivery, indication for care 06/17/2017  . SVD (spontaneous vaginal delivery) 06/17/2017  . Gestational hypertension 10/02/2014  . Encounter for fetal anatomic survey   . [redacted] weeks gestation of pregnancy     Past Surgical History:  Procedure Laterality Date  . NO PAST SURGERIES      OB History    Gravida  2   Para  2   Term  2   Preterm  0   AB  0   Living  2     SAB  0   TAB  0   Ectopic  0   Multiple  0   Live Births  2            Home Medications    Prior to Admission medications   Medication Sig Start Date End Date Taking? Authorizing Provider  aspirin EC 81 MG tablet Take 81 mg daily by mouth.    [provider]    Family History Family History  Problem Relation Age of Onset  . Diabetes Maternal Grandmother   . Hypertension Maternal Grandmother     Social History Social History   Tobacco Use  . Smoking status: Current Every Day Smoker    Types: Cigarettes    Last attempt to quit: 10/14/2016    Years since quitting: 1.1  . Smokeless tobacco: Never Used  Substance Use Topics  . Alcohol use: Yes    Comment: occasionally  . Drug use: Yes    Types: Marijuana, Cocaine    Comment: Denies current cocaine use     Allergies   Peanuts [peanut oil] and Nickel   Review of Systems Review of Systems  Constitutional: Negative for chills and fever.  Respiratory: Negative for shortness of breath.   Cardiovascular: Negative for chest pain.  Gastrointestinal: Positive for abdominal pain and nausea. Negative for constipation, diarrhea and melena.  Genitourinary: Negative for dysuria, vaginal bleeding and vaginal discharge.     Physical Exam Triage Vital Signs ED Triage Vitals  Enc Vitals Group     BP 12/20/17 1143 130/75     Pulse  Rate 12/20/17 1143 69     Resp 12/20/17 1143 18     Temp 12/20/17 1143 98.5 F (36.9 C)     Temp Source 12/20/17 1143 Oral     SpO2 12/20/17 1143 100 %     Weight --      Height --      Head Circumference --      Peak Flow --      Pain Score 12/20/17 1140 5     Pain Loc --      Pain Edu? --      Excl. in GC? --    No data found.  Updated Vital Signs BP 130/75 (BP Location: Left Arm)   Pulse 69   Temp 98.5 F (36.9 C) (Oral)   Resp 18   SpO2 100%   Physical Exam  UC Treatments / Results  Labs (all labs ordered are listed, but only abnormal results are displayed) Labs Reviewed  POCT URINALYSIS DIP (DEVICE) - Abnormal; Notable for the following components:      Result Value   Hgb urine dipstick TRACE (*)    All other components within normal limits  POCT PREGNANCY, URINE    EKG None  Radiology No results  found.  Procedures Procedures (including critical care time)  Medications Ordered in UC Medications - No data to display  Initial Impression / Assessment and Plan / UC Course  I have reviewed the triage vital signs and the nursing notes.  Pertinent labs & imaging results that were available during my care of the patient were reviewed by me and considered in my medical decision making (see chart for details).    Final Clinical Impressions(s) / UC Diagnoses   Final diagnoses:  Generalized abdominal pain  Physical examination unremarkable. Symptoms are most likely from Depo provera. Advised patient to follow up with her OBGYN.    Discharge Instructions     I believe your abdominal pain, nausea, and headache are due to the depo. I would advise you to make an appointment to see your OBGYN    ED Prescriptions    None     Controlled Substance Prescriptions [place] Controlled Substance Registry consulted? Not Applicable  Lucia Estelle, NP 12/20/17 1223

## 2017-12-20 NOTE — ED Triage Notes (Signed)
Abdominal pain for 2-3 hours every morning.  Pain for 2 weeks.  Patient particularly notices pain in the morning after getting up.  No nausea, no vomiting.  Patient reports diarrhea every other day

## 2017-12-20 NOTE — Discharge Instructions (Addendum)
I believe your abdominal pain, nausea, and headache are due to the depo. I would advise you to make an appointment to see your OBGYN

## 2018-06-19 ENCOUNTER — Ambulatory Visit (HOSPITAL_COMMUNITY)
Admission: EM | Admit: 2018-06-19 | Discharge: 2018-06-19 | Disposition: A | Discharge status: Home / Self Care | Payer: Self-pay | Attending: Family Medicine | Admitting: Family Medicine

## 2018-06-19 ENCOUNTER — Encounter (HOSPITAL_COMMUNITY): Payer: Self-pay | Admitting: Emergency Medicine

## 2018-06-19 ENCOUNTER — Other Ambulatory Visit: Payer: Self-pay

## 2018-06-19 DIAGNOSIS — T300 Burn of unspecified body region, unspecified degree: Secondary | ICD-10-CM | POA: Insufficient documentation

## 2018-06-19 DIAGNOSIS — R1030 Lower abdominal pain, unspecified: Secondary | ICD-10-CM | POA: Insufficient documentation

## 2018-06-19 MED ORDER — MUPIROCIN 2 % EX OINT: TOPICAL_OINTMENT | CUTANEOUS | 0 refills | Status: DC

## 2018-06-19 NOTE — ED Triage Notes (Signed)
PT reports lower abdominal pain that started today. PT is on her menstrual.  PT also reports grease burn to left thigh from cooking today.

## 2018-06-19 NOTE — ED Provider Notes (Signed)
Patient: Samantha Warner MRN: [id number] DOB: [birth year] PCP: Patient, No Pcp Per     Subjective:  Chief Complaint  Patient presents with  . Abdominal Pain  . Burn    HPI: The patient is a 22 y.o. female who presents today for abdominal pain that started yesterday. She was working and just started to have pains around 4:30pm in her lower abdominal area. She states it changes from left to right. No fever/chills. Pain rated as a 5/10 and is described as achy and can be sharp. Pain is intermittent and can radiate down her "butt cheeks." She has not taken any over the counter pain medication. Fast food seems to make it worse. She states she has had a few episodes of diarrhea, but nothing too out of the normal. NO blood in her stool. She is on her period, but this feels different from her regular menstrual pain. She has had no surgeries on her abdomen. She has no N/V. She has not been around sick contacts.   She also has a burn on her left inner thigh. She was cooking and grease and moved the pan and grease came out and landed on her inner thigh. She has not put anything on it. She does have a blister.   Review of Systems  Constitutional: Negative for chills and fever.  HENT: Negative for congestion.   Respiratory: Negative for cough and shortness of breath.   Cardiovascular: Negative for chest pain and palpitations.  Gastrointestinal: Positive for abdominal pain and diarrhea. Negative for blood in stool, constipation, nausea and vomiting.  Genitourinary: Negative for dysuria, frequency and urgency.  Skin:       Burn on left thigh     Allergies Patient is allergic to peanuts [peanut oil] and nickel.  Past Medical History Patient  has a past medical history of Anemia, Herpes, Pregnancy induced hypertension, and UTI (urinary tract infection).  Surgical History Patient  has a past surgical history that includes No past surgeries.  Family History Pateint's family history includes  Diabetes in her maternal grandmother; Hypertension in her maternal grandmother.  Social History Patient  reports that she has been smoking cigarettes. She has never used smokeless tobacco. She reports that she drinks alcohol. She reports that she has current or past drug history. Drugs: Marijuana and Cocaine.    Objective: Vitals:   06/19/18 1748  BP: 133/62  Pulse: 68  Resp: 16  Temp: 98.7 F (37.1 C)  TempSrc: Oral  SpO2: 99%    There is no height or weight on file to calculate BMI.  Physical Exam  Constitutional: She appears well-developed and well-nourished.  Non-toxic appearance. She does not appear ill.  Cardiovascular: Normal rate, regular rhythm and normal heart sounds.  Pulmonary/Chest: Effort normal and breath sounds normal.  Abdominal: Soft. Normal appearance and bowel sounds are normal. She exhibits no distension. There is tenderness in the left lower quadrant. There is no rigidity and no guarding.  Skin: Skin is warm.  Long burn on left inner thigh with blister. Superficial burn   Vitals reviewed.      Assessment/plan: Burn -superficial burn. Discussed no indication for any prophylaxis, but important to keep clean. Can cleanse with mild soap and water and shouldn't pop blister. If blister pops can get non stick tefla and keep covered. Will send in bactroban to apply as well. If too expensive can get polysporin over the counter. Wound infection precautions given.   Lower abdominal pain -non acute abdomen. Soft and no  abnormal findings except mild TTP in left lower quadrant. Discussed it could be more adnexal with her being on period. We are going to do conservative therapy with advil, heating pad, and liquid/bland diet over next day or two. Precautions given to return to ER if fever/N/V or increased pain in right quadrant.      Orland Mustard, MD  06/19/2018    Orland Mustard, MD 06/19/18 [phone]

## 2018-07-24 ENCOUNTER — Ambulatory Visit (HOSPITAL_COMMUNITY)
Admission: EM | Admit: 2018-07-24 | Discharge: 2018-07-24 | Disposition: A | Discharge status: Home / Self Care | Payer: Self-pay | Attending: Family Medicine | Admitting: Family Medicine

## 2018-07-24 ENCOUNTER — Other Ambulatory Visit: Payer: Self-pay

## 2018-07-24 ENCOUNTER — Encounter (HOSPITAL_COMMUNITY): Payer: Self-pay

## 2018-07-24 DIAGNOSIS — H6123 Impacted cerumen, bilateral: Secondary | ICD-10-CM | POA: Insufficient documentation

## 2018-07-24 DIAGNOSIS — B9789 Other viral agents as the cause of diseases classified elsewhere: Secondary | ICD-10-CM | POA: Insufficient documentation

## 2018-07-24 DIAGNOSIS — J069 Acute upper respiratory infection, unspecified: Secondary | ICD-10-CM | POA: Insufficient documentation

## 2018-07-24 MED ORDER — FLUTICASONE PROPIONATE 50 MCG/ACT NA SUSP: 2.0000 | Freq: Every day | NASAL | 0 refills | Status: DC

## 2018-07-24 MED ORDER — BENZONATATE 100 MG PO CAPS: 100.0000 mg | ORAL_CAPSULE | Freq: Three times a day (TID) | ORAL | 0 refills | Status: DC

## 2018-07-24 MED ORDER — CETIRIZINE-PSEUDOEPHEDRINE ER 5-120 MG PO TB12: 1.0000 | ORAL_TABLET | Freq: Every day | ORAL | 0 refills | Status: DC

## 2018-07-24 NOTE — ED Triage Notes (Signed)
Pt cc coughing and congestion x 3 days

## 2018-07-24 NOTE — ED Provider Notes (Signed)
[facility]   [id number] 07/24/18 Arrival Time: 1838   CC: URI symptoms   SUBJECTIVE: History from: patient.  Samantha Warner is a 23 y.o. female who presents with abrupt onset of nasal congestion, runny nose, drainage, sore throat and productive cough x 3-4 days.  Admits to sick exposure to son with similar symptoms.  Has tried OTC medications with temporary relief.  Denies aggravating factors.  Reports previous symptoms in the past.  Complains of associated ears feeling clogged, and muffled hearing.   Denies fever, chills, fatigue, sinus pain, SOB, wheezing, chest pain, nausea, changes in bowel or bladder habits.     Received flu shot this year: no.  ROS: As per HPI.  Past Medical History:  Diagnosis Date  . Anemia   . Herpes   . Pregnancy induced hypertension   . UTI (urinary tract infection)    Past Surgical History:  Procedure Laterality Date  . NO PAST SURGERIES     Allergies  Allergen Reactions  . Peanuts [Peanut Oil] Anaphylaxis  . Nickel Rash   No current facility-administered medications on file prior to encounter.    Current Outpatient Medications on File Prior to Encounter  Medication Sig Dispense Refill  . aspirin EC 81 MG tablet Take 81 mg daily by mouth.    . mupirocin ointment (BACTROBAN) 2 % Apply to burn up to three times a day 22 g 0   Social History   Socioeconomic History  . Marital status: Single    Spouse name: Not on file  . Number of children: Not on file  . Years of education: Not on file  . Highest education level: Not on file  Occupational History  . Not on file  Social Needs  . Financial resource strain: Not on file  . Food insecurity:    Worry: Not on file    Inability: Not on file  . Transportation needs:    Medical: Not on file    Non-medical: Not on file  Tobacco Use  . Smoking status: Current Every Day Smoker    Types: Cigarettes    Last attempt to quit: 10/14/2016    Years since quitting: 1.7  . Smokeless  tobacco: Never Used  Substance and Sexual Activity  . Alcohol use: Yes    Comment: occasionally  . Drug use: Yes    Types: Marijuana, Cocaine    Comment: Denies current cocaine use  . Sexual activity: Yes    Birth control/protection: Injection  Lifestyle  . Physical activity:    Days per week: Not on file    Minutes per session: Not on file  . Stress: Not on file  Relationships  . Social connections:    Talks on phone: Not on file    Gets together: Not on file    Attends religious service: Not on file    Active member of club or organization: Not on file    Attends meetings of clubs or organizations: Not on file    Relationship status: Not on file  . Intimate partner violence:    Fear of current or ex partner: Not on file    Emotionally abused: Not on file    Physically abused: Not on file    Forced sexual activity: Not on file  Other Topics Concern  . Not on file  Social History Narrative  . Not on file   Family History  Problem Relation Age of Onset  . Diabetes Maternal Grandmother   . Hypertension Maternal  Grandmother     OBJECTIVE:  Vitals:   07/24/18 1946 07/24/18 1948  BP: 127/63   Pulse: 71   Resp: 18   Temp: 99.1 F (37.3 C)   TempSrc: Oral   SpO2: 100%   Weight:  175 lb (79.4 kg)     General appearance: alert; appears mildly fatigued, but nontoxic; speaking in full sentences and tolerating own secretions HEENT: NCAT; Ears: EACs obstructed by cerumen; Eyes: PERRL.  EOM grossly intact. Nose: nares patent with mild clear rhinorrhea, turbinates swollen and erythematous, Throat: oropharynx clear, tonsils non erythematous or enlarged, uvula midline  Neck: supple without LAD Lungs: unlabored respirations, symmetrical air entry; cough: mild; no respiratory distress; CTAB Heart: regular rate and rhythm.  Radial pulses 2+ symmetrical bilaterally Skin: warm and dry Psychological: alert and cooperative; normal mood and affect   PROCEDURE:  Consent granted.   Bilateral ear lavage performed by EMT Steward DroneBrenda.  RT TM visualized, LT TM partially obstructed, but TM does not appear erythematous or retracted.  PT tolerated procedure well.     ASSESSMENT & PLAN:  1. Viral URI with cough   2. Bilateral impacted cerumen     Meds ordered this encounter  Medications  . fluticasone (FLONASE) 50 MCG/ACT nasal spray    Sig: Place 2 sprays into both nostrils daily.    Dispense:  16 g    Refill:  0    Order Specific Question:   Supervising Provider    Answer:   Eustace MooreNELSON, YVONNE SUE [[id number]]  . cetirizine-pseudoephedrine (ZYRTEC-D) 5-120 MG tablet    Sig: Take 1 tablet by mouth daily.    Dispense:  30 tablet    Refill:  0    Order Specific Question:   Supervising Provider    Answer:   Eustace MooreNELSON, YVONNE SUE [[id number]]  . benzonatate (TESSALON) 100 MG capsule    Sig: Take 1 capsule (100 mg total) by mouth every 8 (eight) hours.    Dispense:  21 capsule    Refill:  0    Order Specific Question:   Supervising Provider    Answer:   Eustace MooreELSON, YVONNE SUE [[id number]]   Ear lavage performed Get plenty of rest and push fluids Tessalon Perles prescribed for cough Zyrtec-D prescribed for nasal congestion, runny nose, and/or sore throat Flonase prescribed for nasal congestion and runny nose Use medications daily for symptom relief Use OTC medications like ibuprofen or tylenol as needed fever or pain Follow up with PCP if symptoms persist Return or go to ER if you have any new or worsening symptoms fever, chills, nausea, vomiting, chest pain, cough, shortness of breath, wheezing, abdominal pain, changes in bowel or bladder habits, etc...  Reviewed expectations re: course of current medical issues. Questions answered. Outlined signs and symptoms indicating need for more acute intervention. Patient verbalized understanding. After Visit Summary given.         Rennis HardingWurst, Breyon Sigg, PA-C 07/24/18 2047

## 2018-07-24 NOTE — Discharge Instructions (Signed)
Ear lavage performed Get plenty of rest and push fluids Tessalon Perles prescribed for cough Zyrtec-D prescribed for nasal congestion, runny nose, and/or sore throat Flonase prescribed for nasal congestion and runny nose Use medications daily for symptom relief Use OTC medications like ibuprofen or tylenol as needed fever or pain Follow up with PCP if symptoms persist Return or go to ER if you have any new or worsening symptoms fever, chills, nausea, vomiting, chest pain, cough, shortness of breath, wheezing, abdominal pain, changes in bowel or bladder habits, etc..Marland Kitchen

## 2018-09-16 ENCOUNTER — Telehealth (HOSPITAL_COMMUNITY): Payer: Self-pay

## 2018-09-16 ENCOUNTER — Encounter (HOSPITAL_COMMUNITY): Payer: Self-pay | Admitting: Emergency Medicine

## 2018-09-16 ENCOUNTER — Ambulatory Visit (HOSPITAL_COMMUNITY)
Admission: EM | Admit: 2018-09-16 | Discharge: 2018-09-16 | Disposition: A | Discharge status: Home / Self Care | Payer: Medicaid Other | Attending: Family Medicine | Admitting: Family Medicine

## 2018-09-16 DIAGNOSIS — Z3202 Encounter for pregnancy test, result negative: Secondary | ICD-10-CM

## 2018-09-16 DIAGNOSIS — R1033 Periumbilical pain: Secondary | ICD-10-CM

## 2018-09-16 LAB — POCT URINALYSIS DIP (DEVICE)
Bilirubin Urine: NEGATIVE
Glucose, UA: NEGATIVE mg/dL
Hgb urine dipstick: NEGATIVE
Ketones, ur: NEGATIVE mg/dL
Leukocytes,Ua: NEGATIVE
Nitrite: NEGATIVE
PH: 6.5 (ref 5.0–8.0)
Protein, ur: NEGATIVE mg/dL
Specific Gravity, Urine: 1.025 (ref 1.005–1.030)
Urobilinogen, UA: 0.2 mg/dL (ref 0.0–1.0)

## 2018-09-16 LAB — POCT PREGNANCY, URINE: PREG TEST UR: NEGATIVE

## 2018-09-16 MED ORDER — SIMETHICONE 40 MG/0.6ML PO SUSP: 40.0000 mg | Freq: Four times a day (QID) | ORAL | 0 refills | Status: DC | PRN

## 2018-09-16 NOTE — ED Provider Notes (Signed)
[facility]    CSN: [id number] Arrival date & time: 09/16/18  1249     History   Chief Complaint Chief Complaint  Patient presents with  . Abdominal Pain    HPI Samantha WEISMANN is a 23 y.o. female.   Modene presents with complaints of intermittent sharp abdominal pain. Comes and goes. Worse after eating fast food she has noticed. Has had loose stools for the past 1-2 weeks as well. No current pain. Normal urination. Non bloody non bilious emesis a few days ago but not since. No nausea. She has increased gas. No recent travel. No known ill contacts. No fever. Doesn't know when her last period was due to having a nexplanon and irregular periods. Denies vaginal symptoms. Sexually active with one partner and doesn't use condoms. Per chart review has been evaluated for abdominal pain multiple times over the past year. Hx of uti's, anemia, herpes.     ROS per HPI, negative if not otherwise mentioned.      Past Medical History:  Diagnosis Date  . Anemia   . Herpes   . Pregnancy induced hypertension   . UTI (urinary tract infection)     Patient Active Problem List   Diagnosis Date Noted  . Labor and delivery, indication for care 06/17/2017  . SVD (spontaneous vaginal delivery) 06/17/2017  . Gestational hypertension 10/02/2014  . Encounter for fetal anatomic survey   . [redacted] weeks gestation of pregnancy     Past Surgical History:  Procedure Laterality Date  . NO PAST SURGERIES      OB History    Gravida  2   Para  2   Term  2   Preterm  0   AB  0   Living  2     SAB  0   TAB  0   Ectopic  0   Multiple  0   Live Births  2            Home Medications    Prior to Admission medications   Medication Sig Start Date End Date Taking? Authorizing Provider  aspirin EC 81 MG tablet Take 81 mg daily by mouth.    [provider]  benzonatate (TESSALON) 100 MG capsule Take 1 capsule (100 mg total) by mouth every 8 (eight) hours. 07/24/18    Wurst, [place], PA-C  cetirizine-pseudoephedrine (ZYRTEC-D) 5-120 MG tablet Take 1 tablet by mouth daily. 07/24/18   Wurst, [place], PA-C  fluticasone (FLONASE) 50 MCG/ACT nasal spray Place 2 sprays into both nostrils daily. 07/24/18   Wurst, [place], PA-C  mupirocin ointment (BACTROBAN) 2 % Apply to burn up to three times a day 06/19/18   Orland Mustard, MD  simethicone [facility]) 40 MG/0.6ML drops Take 0.6 mLs (40 mg total) by mouth 4 (four) times daily as needed for flatulence. 09/16/18   Georgetta Haber, NP    Family History Family History  Problem Relation Age of Onset  . Diabetes Maternal Grandmother   . Hypertension Maternal Grandmother     Social History Social History   Tobacco Use  . Smoking status: Current Every Day Smoker    Types: Cigarettes    Last attempt to quit: 10/14/2016    Years since quitting: 1.9  . Smokeless tobacco: Never Used  Substance Use Topics  . Alcohol use: Yes    Comment: occasionally  . Drug use: Yes    Types: Marijuana, Cocaine    Comment: Denies current cocaine use  Allergies   Peanuts [peanut oil] and Nickel   Review of Systems Review of Systems   Physical Exam Triage Vital Signs ED Triage Vitals [09/16/18 1412]  Enc Vitals Group     BP (!) 144/72     Pulse Rate 81     Resp 18     Temp (!) 97.3 F (36.3 C)     Temp Source Temporal     SpO2 100 %     Weight      Height      Head Circumference      Peak Flow      Pain Score 3     Pain Loc      Pain Edu?      Excl. in GC?    No data found.  Updated Vital Signs BP (!) 144/72 (BP Location: Right Arm)   Pulse 81   Temp (!) 97.3 F (36.3 C) (Temporal)   Resp 18   SpO2 100%   Visual Acuity Right Eye Distance:   Left Eye Distance:   Bilateral Distance:    Right Eye Near:   Left Eye Near:    Bilateral Near:     Physical Exam Constitutional:      General: She is not in acute distress.    Appearance: She is well-developed.  Cardiovascular:     Rate and  Rhythm: Normal rate and regular rhythm.     Heart sounds: Normal heart sounds.  Pulmonary:     Effort: Pulmonary effort is normal.     Breath sounds: Normal breath sounds.  Abdominal:     Tenderness: There is abdominal tenderness in the periumbilical area. There is no right CVA tenderness, left CVA tenderness, guarding or rebound. Negative signs include Murphy's sign and McBurney's sign.  Skin:    General: Skin is warm and dry.  Neurological:     Mental Status: She is alert and oriented to person, place, and time.      UC Treatments / Results  Labs (all labs ordered are listed, but only abnormal results are displayed) Labs Reviewed  POC URINE PREG, ED  POCT URINALYSIS DIP (DEVICE)  POCT PREGNANCY, URINE    EKG None  Radiology No results found.  Procedures Procedures (including critical care time)  Medications Ordered in UC Medications - No data to display  Initial Impression / Assessment and Plan / UC Course  I have reviewed the triage vital signs and the nursing notes.  Pertinent labs & imaging results that were available during my care of the patient were reviewed by me and considered in my medical decision making (see chart for details).     No acute abdominal findings on exam. Non toxic. Afebrile. Eating and drinking. Loose stools and centralized periumbilical abdominal pain which comes and goes, often diet related. Discussed continuing to monitor diet, may try simethicone as well. Encouraged follow up with pcp as may need gi referral. Food allergy/intolerance vs ibs considered? Patient verbalized understanding and agreeable to plan.  Ambulatory out of clinic without difficulty.    Final Clinical Impressions(s) / UC Diagnoses   Final diagnoses:  Periumbilical abdominal pain     Discharge Instructions     May use simethicone as needed for abdominal pain or gas.  Your urine is normal today and negative for pregnancy.  Please follow up with a primary care  provider as you  may need to see a gastroenterologist if persistent symptoms.    ED Prescriptions    Medication Sig  Dispense Auth. Provider   simethicone (MYLICON) 40 MG/0.6ML drops Take 0.6 mLs (40 mg total) by mouth 4 (four) times daily as needed for flatulence. 30 mL Linus Mako B, NP     Controlled Substance Prescriptions  Controlled Substance Registry consulted? Not Applicable   Georgetta Haber, NP 09/16/18 2253

## 2018-09-16 NOTE — Discharge Instructions (Signed)
May use simethicone as needed for abdominal pain or gas.  Your urine is normal today and negative for pregnancy.  Please follow up with a primary care provider as you  may need to see a gastroenterologist if persistent symptoms.

## 2018-09-16 NOTE — ED Triage Notes (Signed)
Pt here for lower abd pain x 1 week; pt requesting pregnancy test

## 2018-09-18 ENCOUNTER — Encounter (HOSPITAL_COMMUNITY): Payer: Self-pay | Admitting: Emergency Medicine

## 2018-09-18 ENCOUNTER — Ambulatory Visit (HOSPITAL_COMMUNITY)
Admission: EM | Admit: 2018-09-18 | Discharge: 2018-09-18 | Disposition: A | Discharge status: Home / Self Care | Payer: Medicaid Other | Attending: Family Medicine | Admitting: Family Medicine

## 2018-09-18 DIAGNOSIS — Z3202 Encounter for pregnancy test, result negative: Secondary | ICD-10-CM

## 2018-09-18 DIAGNOSIS — R102 Pelvic and perineal pain: Secondary | ICD-10-CM | POA: Insufficient documentation

## 2018-09-18 LAB — POCT URINALYSIS DIP (DEVICE)
Bilirubin Urine: NEGATIVE
Glucose, UA: NEGATIVE mg/dL
Ketones, ur: NEGATIVE mg/dL
Leukocytes,Ua: NEGATIVE
Nitrite: NEGATIVE
PH: 7 (ref 5.0–8.0)
Protein, ur: NEGATIVE mg/dL
Specific Gravity, Urine: 1.02 (ref 1.005–1.030)
Urobilinogen, UA: 0.2 mg/dL (ref 0.0–1.0)

## 2018-09-18 LAB — POCT PREGNANCY, URINE: Preg Test, Ur: NEGATIVE

## 2018-09-18 NOTE — Discharge Instructions (Signed)
Urine did not show signs of infection Urine pregnancy was negative Vaginal swab obtained.  We will notify you of abnormal results Rest and push fluids Begin keep a food diary to identify triggers Take simethicone as prescribed Follow up with Joaquin Courts FNP to establish care and for further evaluation and management of chronic abdominal discomfort If you experience new or worsening symptoms return or go to ER such as fever, chills, nausea, vomiting, diarrhea, bloody or dark tarry stools, constipation, urinary symptoms, worsening abdominal discomfort, symptoms that do not improve with medications, inability to keep fluids down, etc..Marland Kitchen

## 2018-09-18 NOTE — ED Triage Notes (Signed)
Pt here for lower abdominal pain x1 month. Was seen here two days ago for same.

## 2018-09-18 NOTE — ED Provider Notes (Signed)
[facility]   [id number] 09/18/18 Arrival Time: 1719  CC: ABDOMINAL DISCOMFORT  SUBJECTIVE:  Samantha Warner is a 23 y.o. female who presents with complaint of abdominal discomfort that began 1 month ago.  Denies a precipitating event, trauma, close contacts with similar symptoms, recent travel or antibiotic use.  Localizes pain to lower abdomen.  Describes as stable, intermittent and sharp/ achy in character.  Was seen 2 days for similar symptoms and diagnosed with periumbilical pain.  Was prescribed simethicone, but did not get medication filled.  States she does not like to take medication if she doesn't have to.   Symptoms made worse with eating, states sausage makes symptoms worse.  Symptoms improved with using the restroom. Last BM this morning and normal for patient.  Complains of associated vomiting x 2 episodes within the past week, and diarrhea x 5 episodes over the past month.    Denies fever, chills, appetite changes, weight changes, nausea, chest pain, SOB, constipation, hematochezia, melena, dysuria, difficulty urinating, increased frequency or urgency, flank pain, loss of bowel or bladder function, vaginal discharge, vaginal odor, vaginal bleeding, dyspareunia, pelvic pain.     Patient's last menstrual period was 09/16/2018.   ROS: As per HPI.  Past Medical History:  Diagnosis Date  . Anemia   . Herpes   . Pregnancy induced hypertension   . UTI (urinary tract infection)    Past Surgical History:  Procedure Laterality Date  . NO PAST SURGERIES     Allergies  Allergen Reactions  . Peanuts [Peanut Oil] Anaphylaxis  . Nickel Rash   No current facility-administered medications on file prior to encounter.    Current Outpatient Medications on File Prior to Encounter  Medication Sig Dispense Refill  . aspirin EC 81 MG tablet Take 81 mg daily by mouth.    . simethicone (MYLICON) 40 MG/0.6ML drops Take 0.6 mLs (40 mg total) by mouth 4 (four) times daily as needed  for flatulence. 30 mL 0   Social History   Socioeconomic History  . Marital status: Single    Spouse name: Not on file  . Number of children: Not on file  . Years of education: Not on file  . Highest education level: Not on file  Occupational History  . Not on file  Social Needs  . Financial resource strain: Not on file  . Food insecurity:    Worry: Not on file    Inability: Not on file  . Transportation needs:    Medical: Not on file    Non-medical: Not on file  Tobacco Use  . Smoking status: Current Every Day Smoker    Types: Cigarettes    Last attempt to quit: 10/14/2016    Years since quitting: 1.9  . Smokeless tobacco: Never Used  Substance and Sexual Activity  . Alcohol use: Yes    Comment: occasionally  . Drug use: Yes    Types: Marijuana, Cocaine    Comment: Denies current cocaine use  . Sexual activity: Yes    Birth control/protection: Injection  Lifestyle  . Physical activity:    Days per week: Not on file    Minutes per session: Not on file  . Stress: Not on file  Relationships  . Social connections:    Talks on phone: Not on file    Gets together: Not on file    Attends religious service: Not on file    Active member of club or organization: Not on file    Attends  meetings of clubs or organizations: Not on file    Relationship status: Not on file  . Intimate partner violence:    Fear of current or ex partner: Not on file    Emotionally abused: Not on file    Physically abused: Not on file    Forced sexual activity: Not on file  Other Topics Concern  . Not on file  Social History Narrative  . Not on file   Family History  Problem Relation Age of Onset  . Diabetes Maternal Grandmother   . Hypertension Maternal Grandmother      OBJECTIVE:  Vitals:   09/18/18 1753  BP: 124/76  Pulse: 82  Resp: 16  Temp: 98.4 F (36.9 C)  SpO2: 100%    General appearance: Alert; NAD HEENT: NCAT.  Oropharynx clear.  Lungs: clear to auscultation  bilaterally without adventitious breath sounds Heart: regular rate and rhythm.  Radial pulses 2+ symmetrical bilaterally Abdomen: soft, non-distended; normal active bowel sounds; mildly TTP over suprapubic region; nontender at McBurney's point; negative Murphy's sign; negative rebound; no guarding Back: no CVA tenderness Extremities: no edema; symmetrical with no gross deformities Skin: warm and dry Neurologic: normal gait Psychological: alert and cooperative; normal mood and affect  LABS: Results for orders placed or performed during the hospital encounter of 09/18/18 (from the past 24 hour(s))  POCT urinalysis dip (device)     Status: Abnormal   Collection Time: 09/18/18  6:47 PM  Result Value Ref Range   Glucose, UA NEGATIVE NEGATIVE mg/dL   Bilirubin Urine NEGATIVE NEGATIVE   Ketones, ur NEGATIVE NEGATIVE mg/dL   Specific Gravity, Urine 1.020 1.005 - 1.030   Hgb urine dipstick TRACE (A) NEGATIVE   pH 7.0 5.0 - 8.0   Protein, ur NEGATIVE NEGATIVE mg/dL   Urobilinogen, UA 0.2 0.0 - 1.0 mg/dL   Nitrite NEGATIVE NEGATIVE   Leukocytes,Ua NEGATIVE NEGATIVE  Pregnancy, urine POC     Status: None   Collection Time: 09/18/18  6:55 PM  Result Value Ref Range   Preg Test, Ur NEGATIVE NEGATIVE    ASSESSMENT & PLAN:  1. Suprapubic discomfort     Urine did not show signs of infection Urine pregnancy was negative Vaginal swab obtained.  We will notify you of abnormal results Rest and push fluids Begin keep a food diary to identify triggers Take simethicone as prescribed Follow up with Joaquin Courts FNP to establish care and for further evaluation and management of chronic abdominal discomfort If you experience new or worsening symptoms return or go to ER such as fever, chills, nausea, vomiting, diarrhea, bloody or dark tarry stools, constipation, urinary symptoms, worsening abdominal discomfort, symptoms that do not improve with medications, inability to keep fluids down,  etc...  Reviewed expectations re: course of current medical issues. Questions answered. Outlined signs and symptoms indicating need for more acute intervention. Patient verbalized understanding. After Visit Summary given.   Rennis Harding, PA-C 09/18/18 1928

## 2018-09-19 LAB — CERVICOVAGINAL ANCILLARY ONLY
Bacterial vaginitis: POSITIVE — AB
Candida vaginitis: NEGATIVE
Chlamydia: NEGATIVE
Neisseria Gonorrhea: NEGATIVE
Trichomonas: POSITIVE — AB

## 2018-09-20 ENCOUNTER — Telehealth (HOSPITAL_COMMUNITY): Payer: Self-pay | Admitting: Emergency Medicine

## 2018-09-20 MED ORDER — METRONIDAZOLE 500 MG PO TABS: 500.0000 mg | ORAL_TABLET | Freq: Two times a day (BID) | ORAL | 0 refills | Status: AC

## 2018-09-20 NOTE — Telephone Encounter (Signed)
Patient called back and given results. All questions answered.

## 2018-09-20 NOTE — Telephone Encounter (Signed)
Bacterial vaginosis is positive. This was not treated at the urgent care visit.  Flagyl 500 mg BID x 7 days #14 no refills sent to patients pharmacy of choice.    Trichomonas is positive. Rx  for Flagyl was sent to the pharmacy of record. Pt needs education to refrain from sexual intercourse for 7 days to give the medicine time to work. Sexual partners need to be notified and tested/treated. Condoms may reduce risk of reinfection. Recheck for further evaluation if symptoms are not improving.   Attempted to reach patient. No answer at this time. Voicemail left.    

## 2018-09-26 ENCOUNTER — Ambulatory Visit (HOSPITAL_COMMUNITY)
Admission: EM | Admit: 2018-09-26 | Discharge: 2018-09-26 | Disposition: A | Discharge status: Home / Self Care | Payer: Self-pay | Attending: Family Medicine | Admitting: Family Medicine

## 2018-09-26 ENCOUNTER — Encounter (HOSPITAL_COMMUNITY): Payer: Self-pay | Admitting: Emergency Medicine

## 2018-09-26 DIAGNOSIS — H66001 Acute suppurative otitis media without spontaneous rupture of ear drum, right ear: Secondary | ICD-10-CM

## 2018-09-26 DIAGNOSIS — H9201 Otalgia, right ear: Secondary | ICD-10-CM

## 2018-09-26 MED ORDER — AMOXICILLIN 875 MG PO TABS: 875.0000 mg | ORAL_TABLET | Freq: Two times a day (BID) | ORAL | 0 refills | Status: AC

## 2018-09-26 NOTE — ED Triage Notes (Signed)
Pt c/o R ear pain x 2weeks

## 2018-09-27 NOTE — ED Provider Notes (Signed)
[facility]   [id number] 09/26/18 Arrival Time: 1718  ASSESSMENT & PLAN:  1. Right ear pain   2. Non-recurrent acute suppurative otitis media of right ear without spontaneous rupture of tympanic membrane    No sign of TM rupture.  Meds ordered this encounter  Medications  . amoxicillin (AMOXIL) 875 MG tablet    Sig: Take 1 tablet (875 mg total) by mouth 2 (two) times daily for 10 days.    Dispense:  20 tablet    Refill:  0   Will f/u here if not improving over the next 48-72 hours. Discussed typical duration of symptoms. OTC symptom care as needed. Ensure adequate fluid intake and rest. May f/u with PCP or here as needed.  Reviewed expectations re: course of current medical issues. Questions answered. Outlined signs and symptoms indicating need for more acute intervention. Patient verbalized understanding. After Visit Summary given.   SUBJECTIVE: History from: patient.  Samantha Warner is a 23 y.o. female who presents with complaint of right otalgia; without drainage; without bleeding. Onset gradual, 2 weeks ago. Recent cold symptoms: minimal. Fever: no. Overall normal PO intake without n/v. Sick contacts: no. OTC treatment: Tylenol with mild relief. No significant hearing changes. No foreign objects inserted into ear. No h/o frequent ear infections.  Social History   Tobacco Use  Smoking Status Current Every Day Smoker  . Types: Cigarettes  . Last attempt to quit: 10/14/2016  . Years since quitting: 1.9  Smokeless Tobacco Never Used    ROS: As per HPI. All other systems negative.    OBJECTIVE:  Vitals:   09/26/18 1729  BP: 139/77  Pulse: 94  Resp: 18  Temp: 97.8 F (36.6 C)  SpO2: 97%     General appearance: alert; appears fatigued Ear Canal: normal TM: right: erythematous, bulging; left: normal; external ear appears normal Neck: supple without LAD CV; RRR Lungs: unlabored respirations, symmetrical air entry; cough: absent; no respiratory  distress Skin: warm and dry Psychological: alert and cooperative; normal mood and affect  Allergies  Allergen Reactions  . Peanuts [Peanut Oil] Anaphylaxis  . Nickel Rash    Past Medical History:  Diagnosis Date  . Anemia   . Herpes   . Pregnancy induced hypertension   . UTI (urinary tract infection)    Family History  Problem Relation Age of Onset  . Diabetes Maternal Grandmother   . Hypertension Maternal Grandmother    Social History   Socioeconomic History  . Marital status: Single    Spouse name: Not on file  . Number of children: Not on file  . Years of education: Not on file  . Highest education level: Not on file  Occupational History  . Not on file  Social Needs  . Financial resource strain: Not on file  . Food insecurity:    Worry: Not on file    Inability: Not on file  . Transportation needs:    Medical: Not on file    Non-medical: Not on file  Tobacco Use  . Smoking status: Current Every Day Smoker    Types: Cigarettes    Last attempt to quit: 10/14/2016    Years since quitting: 1.9  . Smokeless tobacco: Never Used  Substance and Sexual Activity  . Alcohol use: Yes    Comment: occasionally  . Drug use: Yes    Types: Marijuana, Cocaine    Comment: Denies current cocaine use  . Sexual activity: Yes    Birth control/protection: Injection  Lifestyle  .  Physical activity:    Days per week: Not on file    Minutes per session: Not on file  . Stress: Not on file  Relationships  . Social connections:    Talks on phone: Not on file    Gets together: Not on file    Attends religious service: Not on file    Active member of club or organization: Not on file    Attends meetings of clubs or organizations: Not on file    Relationship status: Not on file  . Intimate partner violence:    Fear of current or ex partner: Not on file    Emotionally abused: Not on file    Physically abused: Not on file    Forced sexual activity: Not on file  Other Topics  Concern  . Not on file  Social History Narrative  . Not on file            Mardella Layman, MD 09/27/18 [phone]

## 2018-12-13 IMAGING — US US ABDOMEN LIMITED
1 series · 14 of 25 positions shown · non-contrast
Comparison: None.

CLINICAL DATA: Nausea.  Abdominal pain since this morning.

EXAM:
ULTRASOUND ABDOMEN LIMITED RIGHT UPPER QUADRANT

[Series 1: us abdomen limited · 0.22mm/px · 14 of 43 slices shown]
[im 1/43]
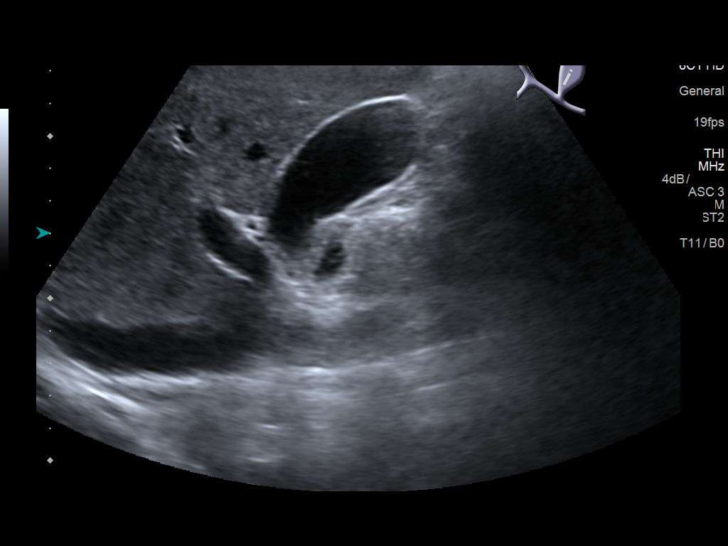
[im 4/43]
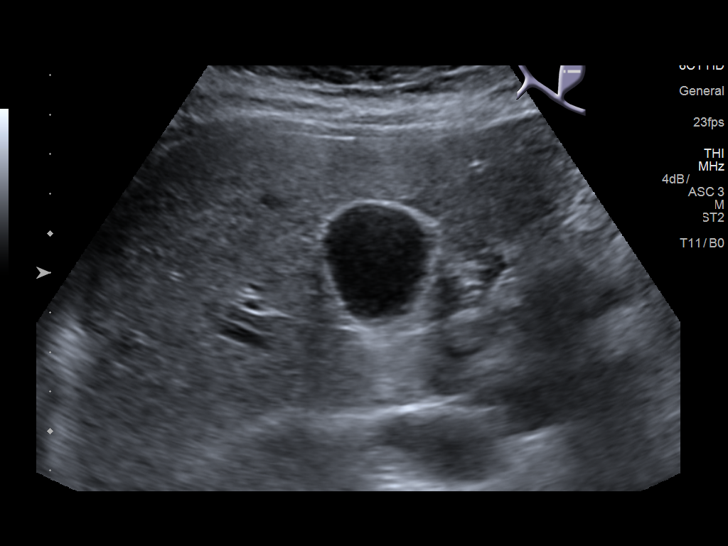
[im 8/43]
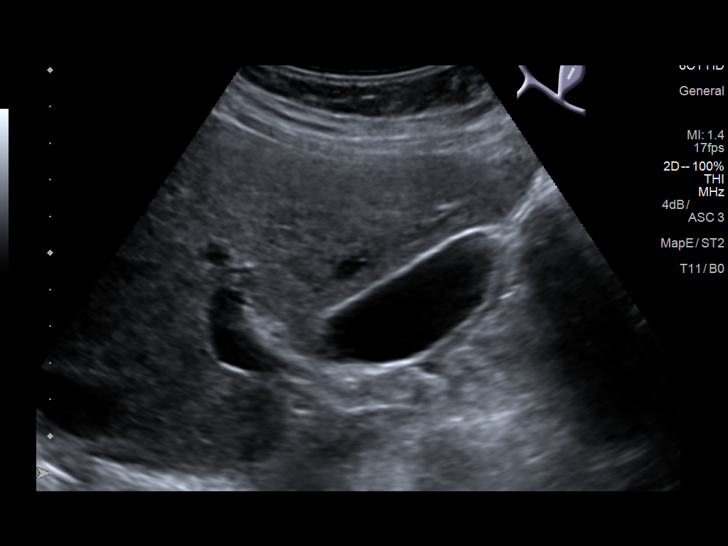
[im 11/43]
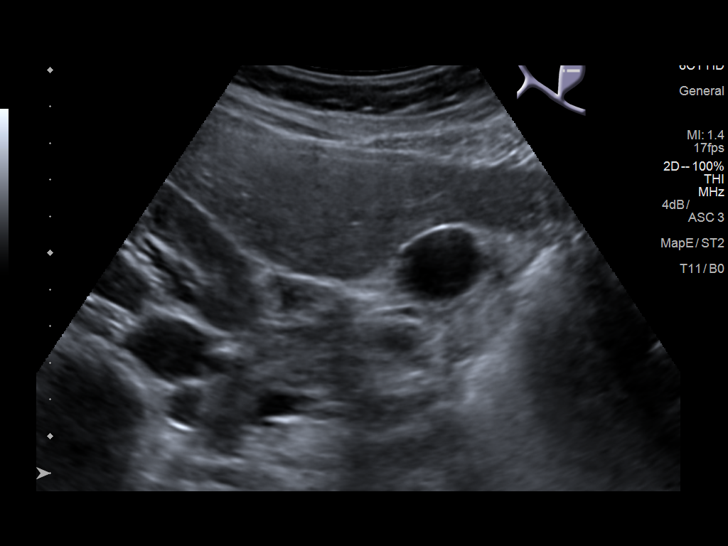
[im 15/43]
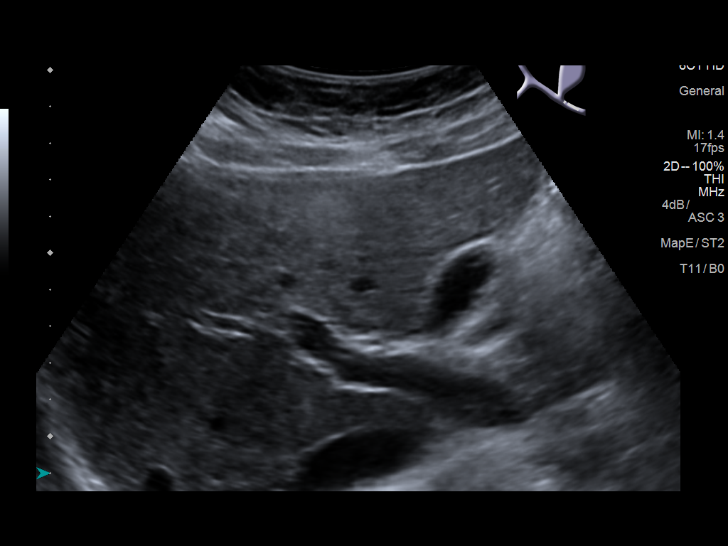
[im 16/43]
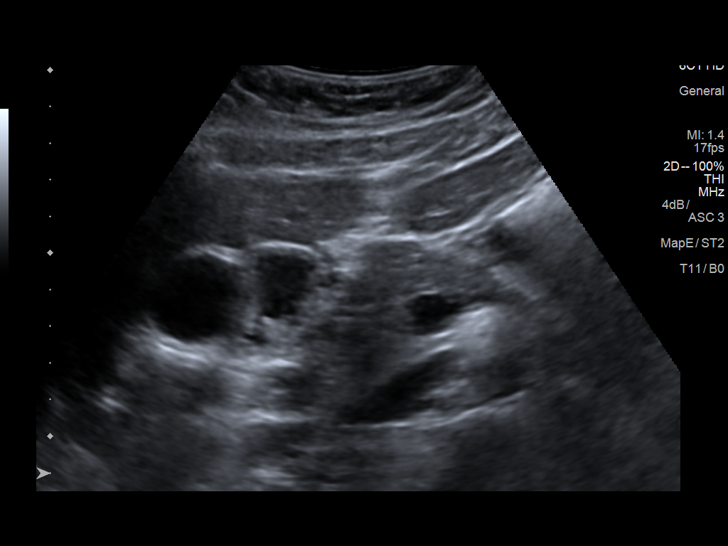
[im 20/43]
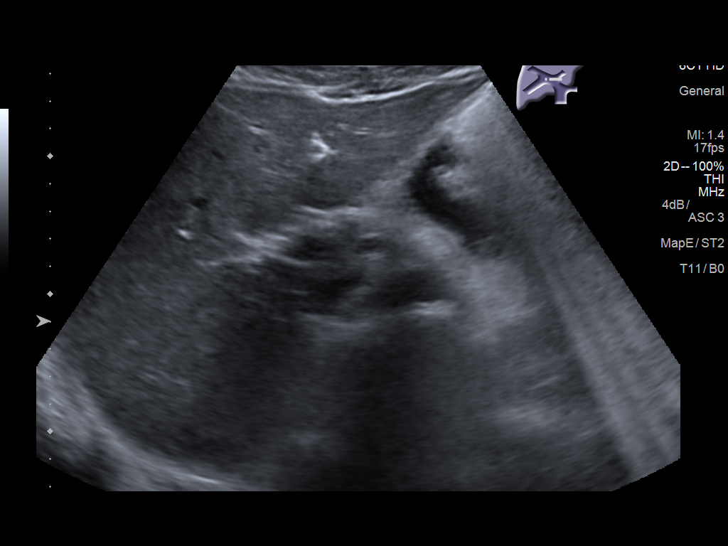
[im 23/43]
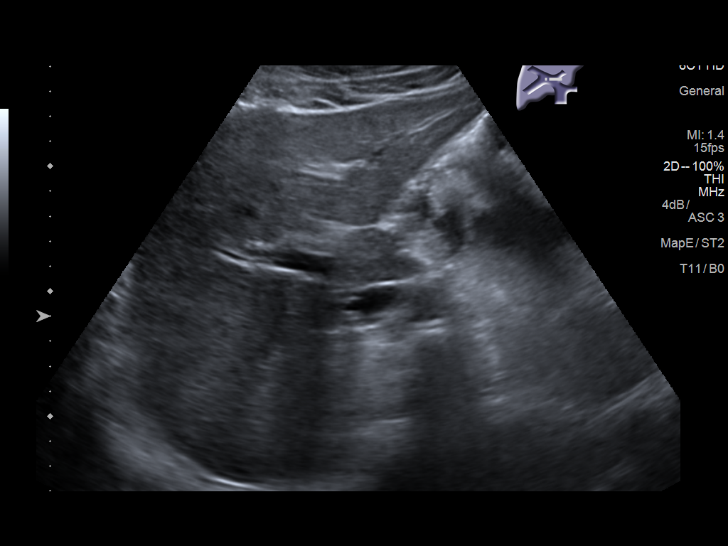
[im 27/43]
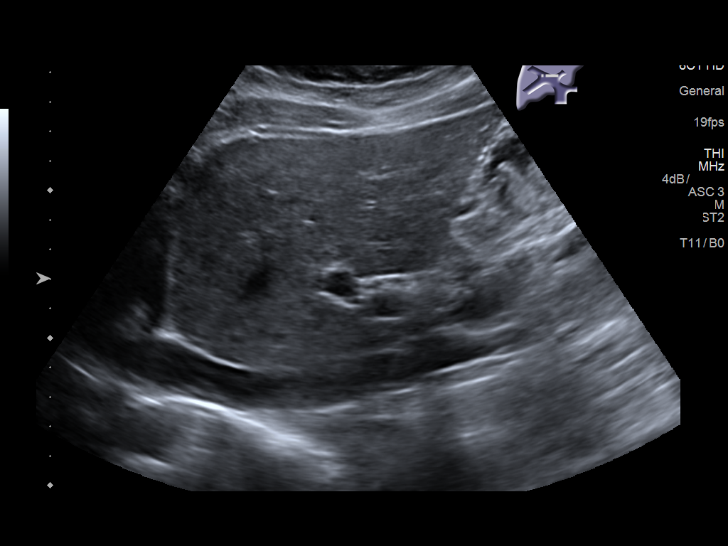
[im 29/43]
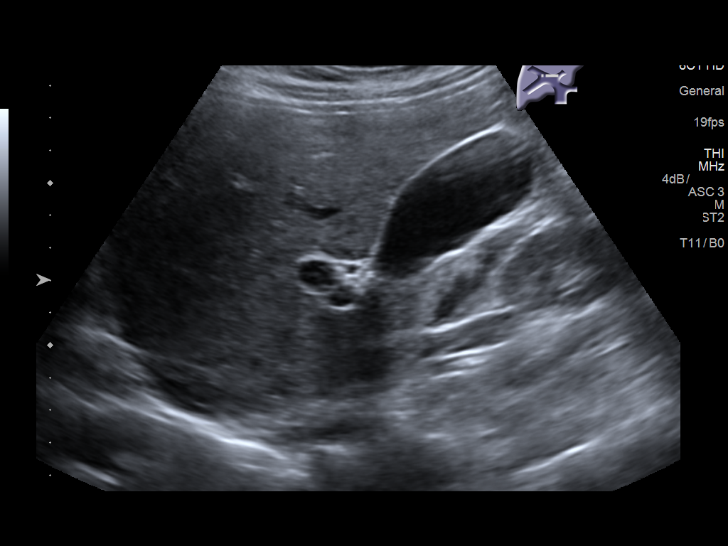
[im 32/43]
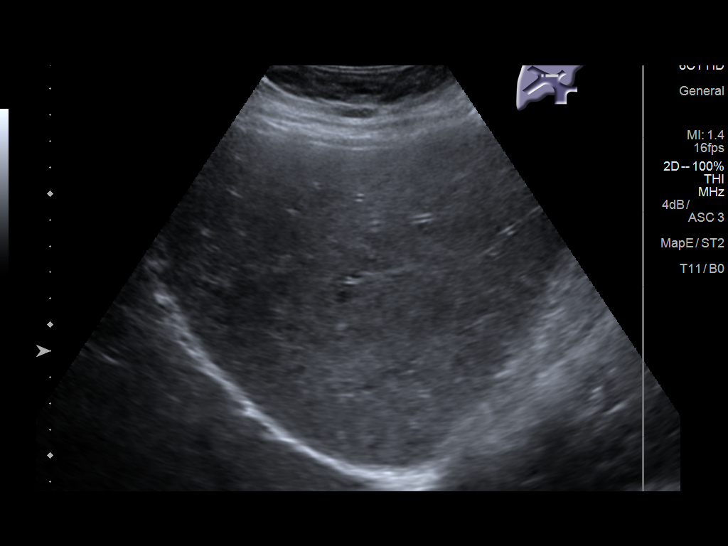
[im 36/43]
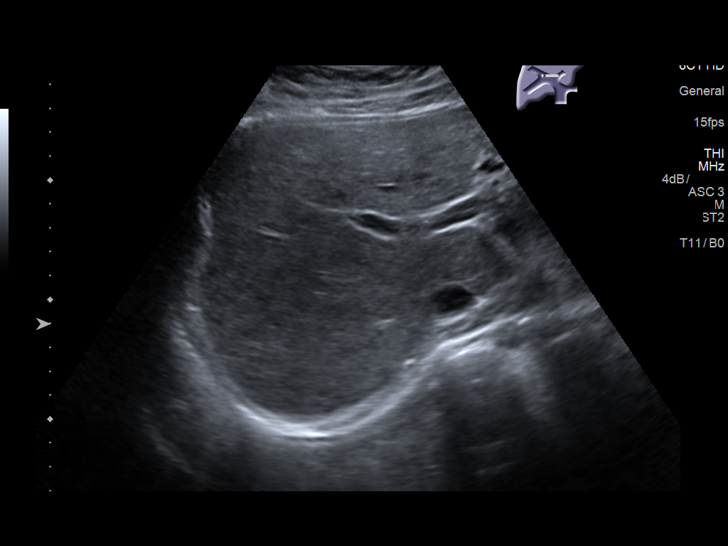
[im 39/43]
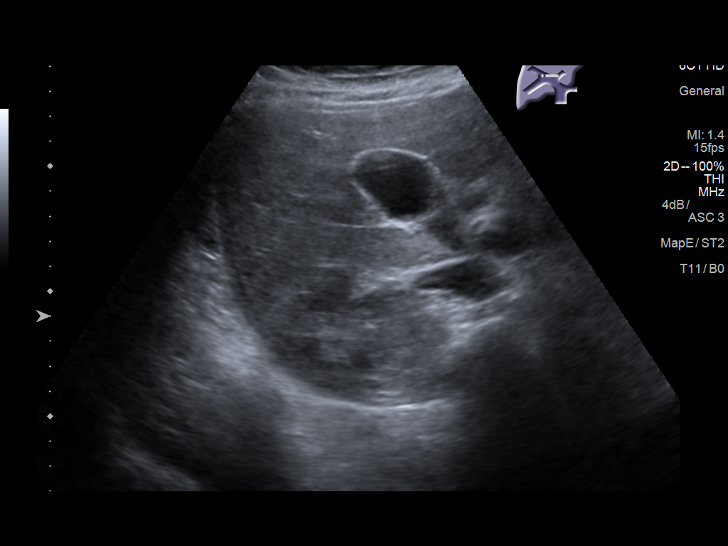
[im 43/43]
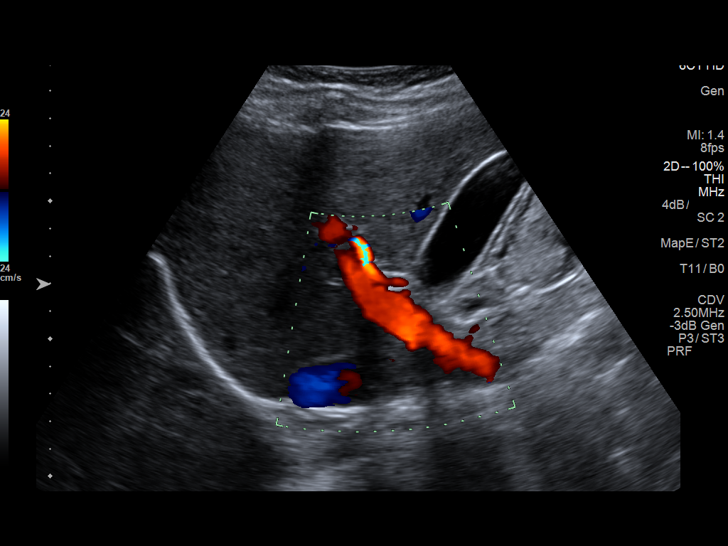

[14 of 25 positions shown; findings below may reference images not displayed]

FINDINGS: Gallbladder:

No gallstones or wall thickening visualized. No sonographic Murphy
sign noted by sonographer.

Common bile duct:

Diameter: 3 mm

Liver:

No focal lesion identified. Within normal limits in parenchymal
echogenicity. Portal vein is patent on color Doppler imaging with
normal direction of blood flow towards the liver.
IMPRESSION: Normal right upper quadrant ultrasound.

## 2019-03-07 ENCOUNTER — Encounter (HOSPITAL_COMMUNITY): Payer: Self-pay

## 2019-03-07 ENCOUNTER — Emergency Department (HOSPITAL_COMMUNITY)
Admission: EM | Admit: 2019-03-07 | Discharge: 2019-03-08 | Disposition: A | Discharge status: Home / Self Care | Payer: Medicaid Other | Attending: Emergency Medicine | Admitting: Emergency Medicine

## 2019-03-07 ENCOUNTER — Other Ambulatory Visit: Payer: Self-pay

## 2019-03-07 DIAGNOSIS — Z5321 Procedure and treatment not carried out due to patient leaving prior to being seen by health care provider: Secondary | ICD-10-CM | POA: Insufficient documentation

## 2019-03-07 DIAGNOSIS — R05 Cough: Secondary | ICD-10-CM | POA: Insufficient documentation

## 2019-03-07 DIAGNOSIS — R059 Cough, unspecified: Secondary | ICD-10-CM

## 2019-03-07 NOTE — ED Triage Notes (Signed)
Pt arrives POV for eval of cough, chills, sore throat and malaise since this AM. Reports all family members in house c/o cough and low grade temps. No one has been tested. Denies known contacts

## 2019-03-08 ENCOUNTER — Emergency Department (HOSPITAL_COMMUNITY): Payer: Medicaid Other

## 2019-03-08 NOTE — ED Notes (Signed)
Patient called again, no answer.

## 2019-03-08 NOTE — ED Notes (Signed)
Called for pt x3 for room. No answer 

## 2019-03-25 ENCOUNTER — Ambulatory Visit (HOSPITAL_COMMUNITY)
Admission: EM | Admit: 2019-03-25 | Discharge: 2019-03-25 | Disposition: A | Discharge status: Home / Self Care | Payer: Medicaid Other | Attending: Family Medicine | Admitting: Family Medicine

## 2019-03-25 ENCOUNTER — Other Ambulatory Visit: Payer: Self-pay

## 2019-03-25 ENCOUNTER — Encounter (HOSPITAL_COMMUNITY): Payer: Self-pay | Admitting: Emergency Medicine

## 2019-03-25 DIAGNOSIS — F17210 Nicotine dependence, cigarettes, uncomplicated: Secondary | ICD-10-CM | POA: Insufficient documentation

## 2019-03-25 DIAGNOSIS — J029 Acute pharyngitis, unspecified: Secondary | ICD-10-CM | POA: Insufficient documentation

## 2019-03-25 DIAGNOSIS — Z20828 Contact with and (suspected) exposure to other viral communicable diseases: Secondary | ICD-10-CM | POA: Insufficient documentation

## 2019-03-25 LAB — POCT RAPID STREP A: Streptococcus, Group A Screen (Direct): NEGATIVE

## 2019-03-25 MED ORDER — AMOXICILLIN 500 MG PO CAPS: 500.0000 mg | ORAL_CAPSULE | Freq: Two times a day (BID) | ORAL | 0 refills | Status: DC

## 2019-03-25 NOTE — ED Provider Notes (Signed)
Samantha Warner    CSN: [id number] Arrival date & time: 03/25/19  [place]      History   Chief Complaint Chief Complaint  Patient presents with   Sore Throat    HPI Samantha Warner is a 23 y.o. female.   She is presenting with a 1 day duration of sore throat.  It began early this morning.  She denies any fevers or chills.  She was at a party this past weekend.  She has been around her boyfriend he has not had any symptoms.  No trouble swallowing or breathing.  Symptoms are coming worse over the course of the day.  HPI  Past Medical History:  Diagnosis Date   Anemia    Herpes    Pregnancy induced hypertension    UTI (urinary tract infection)     Patient Active Problem List   Diagnosis Date Noted   Labor and delivery, indication for care 06/17/2017   SVD (spontaneous vaginal delivery) 06/17/2017   Gestational hypertension 10/02/2014   Encounter for fetal anatomic survey    [redacted] weeks gestation of pregnancy     Past Surgical History:  Procedure Laterality Date   NO PAST SURGERIES      OB History    Gravida  2   Para  2   Term  2   Preterm  0   AB  0   Living  2     SAB  0   TAB  0   Ectopic  0   Multiple  0   Live Births  2            Home Medications    Prior to Admission medications   Medication Sig Start Date End Date Taking? Authorizing Provider  amoxicillin (AMOXIL) 500 MG capsule Take 1 capsule (500 mg total) by mouth 2 (two) times daily. 03/25/19   Rosemarie Ax, MD  aspirin EC 81 MG tablet Take 81 mg daily by mouth.    [provider]  simethicone (MYLICON) 40 KV/4.2VZ drops Take 0.6 mLs (40 mg total) by mouth 4 (four) times daily as needed for flatulence. 09/16/18   Zigmund Gottron, NP    Family History Family History  Problem Relation Age of Onset   Diabetes Maternal Grandmother    Hypertension Maternal Grandmother     Social History Social History   Tobacco Use   Smoking status: Current  Every Day Smoker    Types: Cigarettes    Last attempt to quit: 10/14/2016    Years since quitting: 2.4   Smokeless tobacco: Never Used  Substance Use Topics   Alcohol use: Yes    Comment: occasionally   Drug use: Yes    Types: Marijuana, Cocaine    Comment: Denies current cocaine use     Allergies   Peanuts [peanut oil] and Nickel   Review of Systems Review of Systems  Constitutional: Negative for fever.  HENT: Positive for sore throat. Negative for congestion.   Respiratory: Negative for cough.   Cardiovascular: Negative for chest pain.  Gastrointestinal: Negative for abdominal pain.  Musculoskeletal: Negative for back pain.  Skin: Negative for color change.  Neurological: Negative for weakness.  Hematological: Negative for adenopathy.     Physical Exam Triage Vital Signs ED Triage Vitals  Enc Vitals Group     BP 03/25/19 1850 130/64     Pulse Rate 03/25/19 1850 73     Resp 03/25/19 1850 16     Temp  03/25/19 1850 98 F (36.7 C)     Temp Source 03/25/19 1850 Temporal     SpO2 03/25/19 1850 98 %     Weight --      Height --      Head Circumference --      Peak Flow --      Pain Score 03/25/19 1853 6     Pain Loc --      Pain Edu? --      Excl. in GC? --    No data found.  Updated Vital Signs BP 130/64 (BP Location: Left Arm)    Pulse 73    Temp 98 F (36.7 C) (Temporal)    Resp 16    SpO2 98%   Visual Acuity Right Eye Distance:   Left Eye Distance:   Bilateral Distance:    Right Eye Near:   Left Eye Near:    Bilateral Near:     Physical Exam Gen: NAD, alert, cooperative with exam, well-appearing ENT: normal lips, normal nasal mucosa, tympanic membranes clear and intact bilaterally, exudates demonstrated on the left tonsil, mild erythema of the left tonsil Eye: normal EOM, normal conjunctiva and lids CV:  no edema, +2 pedal pulses, regular rate and rhythm, S1-S2   Resp: no accessory muscle use, non-labored, clear to auscultation bilaterally, no  crackles or wheezes Skin: no rashes, no areas of induration  Neuro: normal tone, normal sensation to touch Psych:  normal insight, alert and oriented MSK: Normal gait, normal strength    UC Treatments / Results  Labs (all labs ordered are listed, but only abnormal results are displayed) Labs Reviewed  NOVEL CORONAVIRUS, NAA (HOSP ORDER, SEND-OUT TO REF LAB; TAT 18-24 HRS)  CULTURE, GROUP A STREP [facility])  POCT RAPID STREP A    EKG   Radiology No results found.  Procedures Procedures (including critical care time)  Medications Ordered in UC Medications - No data to display  Initial Impression / Assessment and Plan / UC Course  I have reviewed the triage vital signs and the nursing notes.  Pertinent labs & imaging results that were available during my care of the patient were reviewed by me and considered in my medical decision making (see chart for details).     Samantha Warner is a 23 year old female that is presenting with sore throat.  The rapid strep was negative.  She does not demonstrate exudates on exam.  Culture was sent.  Will provide with amoxicillin counseled on use if she develops a rash.  Possible for mononucleosis.  COVID testing was sent.  Counseled on supportive care.  Provided work note.  Given occasions to follow-up and return.  Final Clinical Impressions(s) / UC Diagnoses   Final diagnoses:  Sore throat     Discharge Instructions     Please try the antibiotic. Please stop it if you develop a rash  Please avoid sharing drinks with anyone.  Please follow up if your symptoms fail to improve.     ED Prescriptions    Medication Sig Dispense Auth. Provider   amoxicillin (AMOXIL) 500 MG capsule Take 1 capsule (500 mg total) by mouth 2 (two) times daily. 20 capsule Myra RudeSchmitz, Ludger Bones E, MD     Controlled Substance Prescriptions [place] Controlled Substance Registry consulted? n/a   Myra RudeSchmitz, Tomeeka Plaugher E, MD 03/25/19 2003

## 2019-03-25 NOTE — Discharge Instructions (Signed)
Please try the antibiotic. Please stop it if you develop a rash  Please avoid sharing drinks with anyone.  Please follow up if your symptoms fail to improve.

## 2019-03-25 NOTE — ED Triage Notes (Signed)
Pt here for sore throat x 2 days 

## 2019-03-27 LAB — NOVEL CORONAVIRUS, NAA (HOSP ORDER, SEND-OUT TO REF LAB; TAT 18-24 HRS): SARS-CoV-2, NAA: NOT DETECTED

## 2019-03-28 LAB — CULTURE, GROUP A STREP (THRC)

## 2019-06-12 ENCOUNTER — Other Ambulatory Visit: Payer: Self-pay

## 2019-06-12 ENCOUNTER — Ambulatory Visit (HOSPITAL_COMMUNITY)
Admission: EM | Admit: 2019-06-12 | Discharge: 2019-06-12 | Disposition: A | Discharge status: Home / Self Care | Payer: Medicaid Other | Attending: Internal Medicine | Admitting: Internal Medicine

## 2019-06-12 ENCOUNTER — Encounter (HOSPITAL_COMMUNITY): Payer: Self-pay | Admitting: Emergency Medicine

## 2019-06-12 DIAGNOSIS — F1210 Cannabis abuse, uncomplicated: Secondary | ICD-10-CM | POA: Insufficient documentation

## 2019-06-12 DIAGNOSIS — N939 Abnormal uterine and vaginal bleeding, unspecified: Secondary | ICD-10-CM | POA: Insufficient documentation

## 2019-06-12 DIAGNOSIS — Z3202 Encounter for pregnancy test, result negative: Secondary | ICD-10-CM

## 2019-06-12 DIAGNOSIS — R1011 Right upper quadrant pain: Secondary | ICD-10-CM | POA: Insufficient documentation

## 2019-06-12 DIAGNOSIS — R1013 Epigastric pain: Secondary | ICD-10-CM | POA: Insufficient documentation

## 2019-06-12 LAB — POCT URINALYSIS DIP (DEVICE)
Glucose, UA: NEGATIVE mg/dL
Hgb urine dipstick: NEGATIVE
Ketones, ur: NEGATIVE mg/dL
Leukocytes,Ua: NEGATIVE
Nitrite: NEGATIVE
Protein, ur: NEGATIVE mg/dL
Specific Gravity, Urine: >=1.03 (ref 1.005–1.030)
Urobilinogen, UA: 1 mg/dL (ref 0.0–1.0)
pH: 6 (ref 5.0–8.0)

## 2019-06-12 LAB — POCT PREGNANCY, URINE: Preg Test, Ur: NEGATIVE

## 2019-06-12 LAB — POC URINE PREG, ED: Preg Test, Ur: NEGATIVE

## 2019-06-12 MED ORDER — CYCLOBENZAPRINE HCL 5 MG PO TABS: 5.0000 mg | ORAL_TABLET | Freq: Three times a day (TID) | ORAL | 0 refills | Status: DC | PRN

## 2019-06-12 MED ORDER — MELOXICAM 7.5 MG PO TABS: 7.5000 mg | ORAL_TABLET | Freq: Every day | ORAL | 0 refills | Status: DC

## 2019-06-12 NOTE — ED Triage Notes (Signed)
Pain under right ribcage/right epigastric area.  Patient reports she is spotting, vaginal blood noticed 2-3 days ago.   No burning with urination.

## 2019-06-12 NOTE — ED Provider Notes (Signed)
[facility]   MRN: [id number] DOB: 09-18-1995  Subjective:   Samantha Warner is a 23 y.o. female presenting for several day history of epigastric and right upper quadrant abdominal pain that is intermittent and mild to moderate.  She has also had some mild vaginal spotting.  Patient reports that she had vigorous sex with her boyfriend and is worried that he damaged organ of hers.  She states that she had pain during sex the last time.  Has occasional alcohol drink. Smokes marijuana daily. Smokes 1-2 cigarettes per day.   Denies taking any chronic medications.    Allergies  Allergen Reactions  . Peanuts [Peanut Oil] Anaphylaxis  . Nickel Rash    Past Medical History:  Diagnosis Date  . Anemia   . Herpes   . Pregnancy induced hypertension   . UTI (urinary tract infection)      Past Surgical History:  Procedure Laterality Date  . NO PAST SURGERIES      Family History  Problem Relation Age of Onset  . Diabetes Maternal Grandmother   . Hypertension Maternal Grandmother     Social History   Tobacco Use  . Smoking status: Current Every Day Smoker    Types: Cigarettes    Last attempt to quit: 10/14/2016    Years since quitting: 2.6  . Smokeless tobacco: Never Used  Substance Use Topics  . Alcohol use: Yes    Comment: occasionally  . Drug use: Yes    Types: Marijuana, Cocaine    Comment: Denies current cocaine use    ROS   Objective:   Vitals: BP 133/62 (BP Location: Right Arm)   Pulse 74   Temp 98.9 F (37.2 C) (Oral)   Resp 18   LMP 05/28/2019   SpO2 100%   Physical Exam Constitutional:      General: She is not in acute distress.    Appearance: Normal appearance. She is well-developed and normal weight. She is not ill-appearing, toxic-appearing or diaphoretic.  HENT:     Head: Normocephalic and atraumatic.     Right Ear: External ear normal.     Left Ear: External ear normal.     Nose: Nose normal.     Mouth/Throat:     Mouth: Mucous  membranes are moist.     Pharynx: Oropharynx is clear.  Eyes:     General: No scleral icterus.    Extraocular Movements: Extraocular movements intact.     Pupils: Pupils are equal, round, and reactive to light.  Cardiovascular:     Rate and Rhythm: Normal rate and regular rhythm.     Pulses: Normal pulses.     Heart sounds: Normal heart sounds. No murmur. No friction rub. No gallop.   Pulmonary:     Effort: Pulmonary effort is normal. No respiratory distress.     Breath sounds: Normal breath sounds. No stridor. No wheezing, rhonchi or rales.  Abdominal:     General: Bowel sounds are normal. There is no distension.     Palpations: Abdomen is soft. There is no mass.     Tenderness: There is generalized abdominal tenderness (Mild in nature) and tenderness in the right upper quadrant and epigastric area. There is no right CVA tenderness, left CVA tenderness, guarding or rebound.  Skin:    General: Skin is warm and dry.     Coloration: Skin is not pale.     Findings: No rash.  Neurological:     General: No focal deficit present.  Mental Status: She is alert and oriented to person, place, and time.  Psychiatric:        Mood and Affect: Mood normal.        Behavior: Behavior normal.        Thought Content: Thought content normal.        Judgment: Judgment normal.     Results for orders placed or performed during the hospital encounter of 06/12/19 (from the past 24 hour(s))  POCT urinalysis dip (device)     Status: Abnormal   Collection Time: 06/12/19  7:10 PM  Result Value Ref Range   Glucose, UA NEGATIVE NEGATIVE mg/dL   Bilirubin Urine SMALL (A) NEGATIVE   Ketones, ur NEGATIVE NEGATIVE mg/dL   Specific Gravity, Urine >=1.030 1.005 - 1.030   Hgb urine dipstick NEGATIVE NEGATIVE   pH 6.0 5.0 - 8.0   Protein, ur NEGATIVE NEGATIVE mg/dL   Urobilinogen, UA 1.0 0.0 - 1.0 mg/dL   Nitrite NEGATIVE NEGATIVE   Leukocytes,Ua NEGATIVE NEGATIVE  Pregnancy, urine POC     Status: None    Collection Time: 06/12/19  7:16 PM  Result Value Ref Range   Preg Test, Ur NEGATIVE NEGATIVE  POC urine preg, ED (not at [facility])     Status: None   Collection Time: 06/12/19  7:16 PM  Result Value Ref Range   Preg Test, Ur NEGATIVE NEGATIVE    Assessment and Plan :   1. Epigastric pain   2. RUQ pain   3. Vaginal spotting   4. Marijuana abuse     Reassured patient against organ damage from having vigorous sex.  Counseled that she may have had a abdominal strain from vigorous sex.  Will use conservative management with meloxicam and Flexeril.  Recommended patient hold off or cut back on smoking marijuana as well. Counseled patient on potential for adverse effects with medications prescribed/recommended today, ER and return-to-clinic precautions discussed, patient verbalized understanding.    Jaynee Eagles, [place] 06/12/19 1928

## 2019-06-12 NOTE — ED Notes (Signed)
Sent to lobby toilet

## 2019-06-14 LAB — URINE CULTURE: Culture: <10000 — AB

## 2019-06-21 ENCOUNTER — Other Ambulatory Visit: Payer: Self-pay

## 2019-06-21 ENCOUNTER — Emergency Department (HOSPITAL_COMMUNITY)
Admission: EM | Admit: 2019-06-21 | Discharge: 2019-06-22 | Disposition: A | Discharge status: Home / Self Care | Payer: Medicaid Other | Attending: Emergency Medicine | Admitting: Emergency Medicine

## 2019-06-21 ENCOUNTER — Encounter (HOSPITAL_COMMUNITY): Payer: Self-pay | Admitting: *Deleted

## 2019-06-21 DIAGNOSIS — Z5321 Procedure and treatment not carried out due to patient leaving prior to being seen by health care provider: Secondary | ICD-10-CM | POA: Insufficient documentation

## 2019-06-21 DIAGNOSIS — R109 Unspecified abdominal pain: Secondary | ICD-10-CM | POA: Insufficient documentation

## 2019-06-21 LAB — COMPREHENSIVE METABOLIC PANEL
ALT: 20 U/L (ref 0–44)
AST: 20 U/L (ref 15–41)
Albumin: 3.7 g/dL (ref 3.5–5.0)
Alkaline Phosphatase: 63 U/L (ref 38–126)
Anion gap: 8 (ref 5–15)
BUN: 10 mg/dL (ref 6–20)
CO2: 25 mmol/L (ref 22–32)
Calcium: 9 mg/dL (ref 8.9–10.3)
Chloride: 108 mmol/L (ref 98–111)
Creatinine, Ser: 0.68 mg/dL (ref 0.44–1.00)
GFR calc Af Amer: >60 mL/min (ref >60)
GFR calc non Af Amer: >60 mL/min (ref >60)
Glucose, Bld: 91 mg/dL (ref 70–99)
Potassium: 4.1 mmol/L (ref 3.5–5.1)
Sodium: 141 mmol/L (ref 135–145)
Total Bilirubin: 0.5 mg/dL (ref 0.3–1.2)
Total Protein: 7.3 g/dL (ref 6.5–8.1)

## 2019-06-21 LAB — URINALYSIS, ROUTINE W REFLEX MICROSCOPIC
Bacteria, UA: NONE SEEN
Bilirubin Urine: NEGATIVE
Glucose, UA: NEGATIVE mg/dL
Hgb urine dipstick: NEGATIVE
Ketones, ur: NEGATIVE mg/dL
Leukocytes,Ua: NEGATIVE
Nitrite: NEGATIVE
Protein, ur: 30 mg/dL — AB
Specific Gravity, Urine: 1.032 — ABNORMAL HIGH (ref 1.005–1.030)
pH: 5 (ref 5.0–8.0)

## 2019-06-21 LAB — CBC
HCT: 31.6 % — ABNORMAL LOW (ref 36.0–46.0)
Hemoglobin: 9.6 g/dL — ABNORMAL LOW (ref 12.0–15.0)
MCH: 22.4 pg — ABNORMAL LOW (ref 26.0–34.0)
MCHC: 30.4 g/dL (ref 30.0–36.0)
MCV: 73.8 fL — ABNORMAL LOW (ref 80.0–100.0)
Platelets: 433 10*3/uL — ABNORMAL HIGH (ref 150–400)
RBC: 4.28 MIL/uL (ref 3.87–5.11)
RDW: 13.8 % (ref 11.5–15.5)
WBC: 5.3 10*3/uL (ref 4.0–10.5)
nRBC: 0 % (ref 0.0–0.2)

## 2019-06-21 LAB — LIPASE, BLOOD: Lipase: 21 U/L (ref 11–51)

## 2019-06-21 LAB — I-STAT BETA HCG BLOOD, ED (MC, WL, AP ONLY): I-stat hCG, quantitative: <5 m[IU]/mL (ref <5)

## 2019-06-21 MED ORDER — SODIUM CHLORIDE 0.9% FLUSH: 3.0000 mL | Freq: Once | INTRAVENOUS | Status: DC

## 2019-06-21 NOTE — ED Triage Notes (Signed)
Right upper abdominal pain for about 2 weeks, "sharp".

## 2019-06-22 NOTE — ED Notes (Signed)
Pt called for x2 for VS recheck, no response.

## 2019-07-16 ENCOUNTER — Ambulatory Visit (HOSPITAL_COMMUNITY)
Admission: EM | Admit: 2019-07-16 | Discharge: 2019-07-16 | Disposition: A | Discharge status: Home / Self Care | Payer: Medicaid Other | Attending: Internal Medicine | Admitting: Internal Medicine

## 2019-07-16 ENCOUNTER — Encounter (HOSPITAL_COMMUNITY): Payer: Self-pay

## 2019-07-16 ENCOUNTER — Other Ambulatory Visit: Payer: Self-pay

## 2019-07-16 DIAGNOSIS — K580 Irritable bowel syndrome with diarrhea: Secondary | ICD-10-CM

## 2019-07-16 DIAGNOSIS — Z3202 Encounter for pregnancy test, result negative: Secondary | ICD-10-CM

## 2019-07-16 LAB — POCT URINALYSIS DIP (DEVICE)
Bilirubin Urine: NEGATIVE
Glucose, UA: NEGATIVE mg/dL
Hgb urine dipstick: NEGATIVE
Ketones, ur: NEGATIVE mg/dL
Leukocytes,Ua: NEGATIVE
Nitrite: NEGATIVE
Protein, ur: NEGATIVE mg/dL
Specific Gravity, Urine: 1.025 (ref 1.005–1.030)
Urobilinogen, UA: 1 mg/dL (ref 0.0–1.0)
pH: 7.5 (ref 5.0–8.0)

## 2019-07-16 LAB — POCT PREGNANCY, URINE: Preg Test, Ur: NEGATIVE

## 2019-07-16 LAB — POC URINE PREG, ED: Preg Test, Ur: NEGATIVE

## 2019-07-16 MED ORDER — DICYCLOMINE HCL 20 MG PO TABS: ORAL_TABLET | ORAL | 0 refills | Status: DC

## 2019-07-16 NOTE — ED Provider Notes (Signed)
[facility]    CSN: [id number] Arrival date & time: 07/16/19  1910      History   Chief Complaint Chief Complaint  Patient presents with  . Abdominal Pain    HPI Samantha Warner is a 24 y.o. female.   Patient reports to urgent care today for 3 days of lower abdominal pain and episodes of diarrhea. She reports intermittent sharp and sudden lower abdominal pains throughout the day. She reports about 5 incidents a day. She has had loose stools mixed with normal stools throughout this course. She states there are times when she has abdominal pain and needs to have a bowel movement, which then relieves the pain. She denies blood or mucous in her stool. She also endorses mild bloating at times. She states the pain is an 8/10 when at its worst. She reports it is neither getting better or worse. She has tried ibuprofen for the pain with mild relief. She denies family history of bowel disease. She has had no fever, chills, nausea or vomiting.   She denies painful, frequent and urgent urination. Denies vaginal pain or discharge. She has just completed her LMP and has no concern for pregnancy.          Past Medical History:  Diagnosis Date  . Anemia   . Herpes   . Pregnancy induced hypertension   . UTI (urinary tract infection)     Patient Active Problem List   Diagnosis Date Noted  . Labor and delivery, indication for care 06/17/2017  . SVD (spontaneous vaginal delivery) 06/17/2017  . Gestational hypertension 10/02/2014  . Encounter for fetal anatomic survey   . [redacted] weeks gestation of pregnancy     Past Surgical History:  Procedure Laterality Date  . NO PAST SURGERIES      OB History    Gravida  2   Para  2   Term  2   Preterm  0   AB  0   Living  2     SAB  0   TAB  0   Ectopic  0   Multiple  0   Live Births  2            Home Medications    Prior to Admission medications   Medication Sig Start Date End Date Taking? Authorizing  Provider  cyclobenzaprine (FLEXERIL) 5 MG tablet Take 1 tablet (5 mg total) by mouth 3 (three) times daily as needed for muscle spasms. 06/12/19   Jaynee Eagles, PA-C  dicyclomine (BENTYL) 20 MG tablet Take 1 tablet as needed for abdominal cramping up to 4 times a day 07/16/19   Ellie Bryand, Marguerita Beards, PA-C  meloxicam (MOBIC) 7.5 MG tablet Take 1 tablet (7.5 mg total) by mouth daily. 06/12/19   Jaynee Eagles, PA-C  simethicone (MYLICON) 40 TD/4.2AJ drops Take 0.6 mLs (40 mg total) by mouth 4 (four) times daily as needed for flatulence. 09/16/18 06/12/19  Zigmund Gottron, NP    Family History Family History  Problem Relation Age of Onset  . Diabetes Maternal Grandmother   . Hypertension Maternal Grandmother     Social History Social History   Tobacco Use  . Smoking status: Current Every Day Smoker    Types: Cigarettes    Last attempt to quit: 10/14/2016    Years since quitting: 2.7  . Smokeless tobacco: Never Used  . Tobacco comment: smokes marijuana everyday  Substance Use Topics  . Alcohol use: Yes    Comment:  occasionally  . Drug use: Yes    Types: Marijuana, Cocaine    Comment: Denies current cocaine use     Allergies   Peanuts [peanut oil] and Nickel   Review of Systems Review of Systems  Constitutional: Negative for chills and fever.  HENT: Negative for ear pain and sore throat.   Eyes: Negative for pain and visual disturbance.  Respiratory: Negative for cough and shortness of breath.   Cardiovascular: Negative for chest pain and palpitations.  Gastrointestinal: Positive for abdominal distention (bloating), abdominal pain and diarrhea. Negative for anal bleeding, blood in stool, constipation, nausea, rectal pain and vomiting.  Genitourinary: Negative for decreased urine volume, dyspareunia, dysuria, flank pain, frequency, hematuria, menstrual problem, pelvic pain, urgency, vaginal bleeding, vaginal discharge and vaginal pain.  Musculoskeletal: Negative for arthralgias, back pain and  myalgias.  Skin: Negative for color change and rash.  Neurological: Negative for syncope and headaches.  All other systems reviewed and are negative.    Physical Exam Triage Vital Signs ED Triage Vitals  Enc Vitals Group     BP 07/16/19 2013 126/64     Pulse Rate 07/16/19 2013 75     Resp 07/16/19 2013 16     Temp 07/16/19 2013 98.6 F (37 C)     Temp Source 07/16/19 2013 Oral     SpO2 07/16/19 2013 100 %     Weight --      Height --      Head Circumference --      Peak Flow --      Pain Score 07/16/19 2011 7     Pain Loc --      Pain Edu? --      Excl. in GC? --    No data found.  Updated Vital Signs BP 126/64 (BP Location: Right Arm)   Pulse 75   Temp 98.6 F (37 C) (Oral)   Resp 16   LMP 07/08/2019   SpO2 100%   Visual Acuity Right Eye Distance:   Left Eye Distance:   Bilateral Distance:    Right Eye Near:   Left Eye Near:    Bilateral Near:     Physical Exam Vitals and nursing note reviewed.  Constitutional:      General: She is not in acute distress.    Appearance: She is well-developed and normal weight. She is not ill-appearing.  HENT:     Head: Normocephalic and atraumatic.  Eyes:     Conjunctiva/sclera: Conjunctivae normal.  Cardiovascular:     Rate and Rhythm: Normal rate and regular rhythm.     Heart sounds: No murmur. No friction rub. No gallop.   Pulmonary:     Effort: Pulmonary effort is normal. No respiratory distress.     Breath sounds: Normal breath sounds. No wheezing or rales.  Abdominal:     General: Abdomen is flat. Bowel sounds are normal. There is no distension or abdominal bruit. There are no signs of injury.     Palpations: Abdomen is soft. There is no hepatomegaly or splenomegaly.     Tenderness: There is no abdominal tenderness. There is no right CVA tenderness or left CVA tenderness.     Hernia: No hernia is present.  Musculoskeletal:     Cervical back: Neck supple.  Skin:    General: Skin is warm and dry.    Neurological:     General: No focal deficit present.     Mental Status: She is alert and oriented to person, place,  and time.  Psychiatric:        Mood and Affect: Mood normal.        Behavior: Behavior normal.      UC Treatments / Results  Labs (all labs ordered are listed, but only abnormal results are displayed) Labs Reviewed  POCT URINALYSIS DIP (DEVICE)  POCT PREGNANCY, URINE  POC URINE PREG, ED    EKG   Radiology No results found.  Procedures Procedures (including critical care time)  Medications Ordered in UC Medications - No data to display  Initial Impression / Assessment and Plan / UC Course  I have reviewed the triage vital signs and the nursing notes.  Pertinent labs & imaging results that were available during my care of the patient were reviewed by me and considered in my medical decision making (see chart for details).    # Irritable Bowel  - Patient has been seen in Urgent care multiple times in the past year for abdominal discomfort with associated bowel habit changes. Exam is benign without focal findings, history is consistent with IBS. No family history of IBD or red flags currently. Education about suspected condition discussed with patient, FodMaps diet discussed and bentyl prescribed with instructions on usage. Recommended establishing with a Primary care for continued care.    Final Clinical Impressions(s) / UC Diagnoses   Final diagnoses:  Irritable bowel syndrome with diarrhea     Discharge Instructions     Utilize the bentyl 1 tablet up to 4 times a day  Utilize the low fodmap diet and I encourage plenty of water and fiber in your diet.  If you are not improving please follow up with you primary care provider.    ED Prescriptions    Medication Sig Dispense Auth. Provider   dicyclomine (BENTYL) 20 MG tablet  (Status: Discontinued) Take 1 tablet as needed for abdominal cramping 20 tablet Garyn Arlotta, Veryl Speak, PA-C   dicyclomine (BENTYL)  20 MG tablet Take 1 tablet as needed for abdominal cramping up to 4 times a day 20 tablet Rolene Andrades, Veryl Speak, PA-C     PDMP not reviewed this encounter.   Hermelinda Medicus, PA-C 07/17/19 [phone]

## 2019-07-16 NOTE — Discharge Instructions (Signed)
Utilize the bentyl 1 tablet up to 4 times a day  Utilize the low fodmap diet and I encourage plenty of water and fiber in your diet.  If you are not improving please follow up with you primary care provider.

## 2019-07-16 NOTE — ED Triage Notes (Signed)
Pt c/o lower abd pain x3 days; with diarrhea x1-2 days. No n/v, fever, chills, or abnormal vag discharge. Denies dysuria sx. States menstrual ended yesterday.

## 2020-12-14 ENCOUNTER — Ambulatory Visit (HOSPITAL_COMMUNITY)
Admission: EM | Admit: 2020-12-14 | Discharge: 2020-12-14 | Disposition: A | Discharge status: Home / Self Care | Payer: Self-pay | Attending: Internal Medicine | Admitting: Internal Medicine

## 2020-12-14 ENCOUNTER — Encounter (HOSPITAL_COMMUNITY): Payer: Self-pay | Admitting: Emergency Medicine

## 2020-12-14 ENCOUNTER — Other Ambulatory Visit: Payer: Self-pay

## 2020-12-14 DIAGNOSIS — S76919A Strain of unspecified muscles, fascia and tendons at thigh level, unspecified thigh, initial encounter: Secondary | ICD-10-CM

## 2020-12-14 MED ORDER — IBUPROFEN 600 MG PO TABS: 600.0000 mg | ORAL_TABLET | Freq: Four times a day (QID) | ORAL | 0 refills | Status: AC | PRN

## 2020-12-14 NOTE — Discharge Instructions (Addendum)
Gentle range of motion exercises Take medications as prescribed Heating pad will help with pain Return to urgent care if symptoms worsen.

## 2020-12-14 NOTE — ED Triage Notes (Signed)
Pt is present today with bilateral leg pain. Pt states that the pain is mostly in her thigh and she noticed the pain Sunday. Pt describes the pain as a sharp shooting pain down her legs.

## 2020-12-16 NOTE — ED Provider Notes (Signed)
[facility]    CSN: [id number] Arrival date & time: 12/14/20  1854      History   Chief Complaint Chief Complaint  Patient presents with  . Leg Pain    Bilateral     HPI Samantha Warner is a 25 y.o. female comes to urgent care complaining of bilateral lower extremity pain which started on Sunday.  Patient works as a Horticulturist, commercial.   Her pain started a day after she went to work as a Horticulturist, commercial.  She denies any falls or trauma.  She describes the pain as in both legs as well as the thighs.  Is associated with muscle tightness.  Is aggravated by palpation and movement.  No falls or trauma.  Patient denies any calf pain.  No bruising on the lower extremities.  Patient denies any shortness of breath, chest pain or chest pressure.  HPI  Past Medical History:  Diagnosis Date  . Anemia   . Herpes   . Pregnancy induced hypertension   . UTI (urinary tract infection)     Patient Active Problem List   Diagnosis Date Noted  . Labor and delivery, indication for care 06/17/2017  . SVD (spontaneous vaginal delivery) 06/17/2017  . Gestational hypertension 10/02/2014  . Encounter for fetal anatomic survey   . [redacted] weeks gestation of pregnancy     Past Surgical History:  Procedure Laterality Date  . NO PAST SURGERIES      OB History    Gravida  2   Para  2   Term  2   Preterm  0   AB  0   Living  2     SAB  0   IAB  0   Ectopic  0   Multiple  0   Live Births  2            Home Medications    Prior to Admission medications   Medication Sig Start Date End Date Taking? Authorizing Provider  ibuprofen (ADVIL) 600 MG tablet Take 1 tablet (600 mg total) by mouth every 6 (six) hours as needed. 12/14/20  Yes Karle Desrosier, Britta Mccreedy, MD  dicyclomine (BENTYL) 20 MG tablet Take 1 tablet as needed for abdominal cramping up to 4 times a day 07/16/19 12/14/20  Darr, Gerilyn Pilgrim, PA-C  simethicone (MYLICON) 40 MG/0.6ML drops Take 0.6 mLs (40 mg total) by mouth 4 (four) times daily as needed  for flatulence. 09/16/18 06/12/19  Georgetta Haber, NP    Family History Family History  Problem Relation Age of Onset  . Diabetes Maternal Grandmother   . Hypertension Maternal Grandmother     Social History Social History   Tobacco Use  . Smoking status: Current Every Day Smoker    Types: Cigarettes    Last attempt to quit: 10/14/2016    Years since quitting: 4.1  . Smokeless tobacco: Never Used  . Tobacco comment: smokes marijuana everyday  Vaping Use  . Vaping Use: Never used  Substance Use Topics  . Alcohol use: Yes    Comment: occasionally  . Drug use: Yes    Types: Marijuana, Cocaine    Comment: Denies current cocaine use     Allergies   Peanuts [peanut oil] and Nickel   Review of Systems Review of Systems  HENT: Negative.   Respiratory: Negative.   Gastrointestinal: Negative.   Genitourinary: Negative.   Musculoskeletal: Negative.  Negative for gait problem, joint swelling, myalgias and neck pain.  Psychiatric/Behavioral: Negative.  Physical Exam Triage Vital Signs ED Triage Vitals  Enc Vitals Group     BP 12/14/20 1936 124/70     Pulse Rate 12/14/20 1936 64     Resp 12/14/20 1936 18     Temp 12/14/20 1936 (!) 97.3 F (36.3 C)     Temp Source 12/14/20 1936 Temporal     SpO2 12/14/20 1936 100 %     Weight --      Height --      Head Circumference --      Peak Flow --      Pain Score 12/14/20 1938 8     Pain Loc --      Pain Edu? --      Excl. in GC? --    No data found.  Updated Vital Signs BP 124/70 (BP Location: Right Arm)   Pulse 64   Temp (!) 97.3 F (36.3 C) (Temporal)   Resp 18   SpO2 100%   Visual Acuity Right Eye Distance:   Left Eye Distance:   Bilateral Distance:    Right Eye Near:   Left Eye Near:    Bilateral Near:     Physical Exam Vitals and nursing note reviewed.  Constitutional:      General: She is not in acute distress.    Appearance: She is not ill-appearing.  Cardiovascular:     Rate and Rhythm:  Normal rate and regular rhythm.     Pulses: Normal pulses.     Heart sounds: Normal heart sounds.  Pulmonary:     Effort: Pulmonary effort is normal.     Breath sounds: Normal breath sounds.  Musculoskeletal:        General: Tenderness present. No deformity or signs of injury. Normal range of motion.     Right lower leg: No edema.     Left lower leg: No edema.     Comments: No calf tenderness  Skin:    Capillary Refill: Capillary refill takes less than 2 seconds.  Neurological:     General: No focal deficit present.     Mental Status: She is alert and oriented to person, place, and time.      UC Treatments / Results  Labs (all labs ordered are listed, but only abnormal results are displayed) Labs Reviewed - No data to display  EKG   Radiology No results found.  Procedures Procedures (including critical care time)  Medications Ordered in UC Medications - No data to display  Initial Impression / Assessment and Plan / UC Course  I have reviewed the triage vital signs and the nursing notes.  Pertinent labs & imaging results that were available during my care of the patient were reviewed by me and considered in my medical decision making (see chart for details).     1.  Muscle strain involving lower extremities: Patient is hemodynamically stable with no tachycardia.  She has no calf tenderness.  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  Ibuprofen as needed for pain.  Patient is advised to stretch before and after dancing sessions.  Return to urgent care if symptoms worsen.  Patient verbalized understanding of the plan. Final Clinical Impressions(s) / UC Diagnoses   Final diagnoses:  Muscle strain of thigh, unspecified laterality, initial encounter     Discharge Instructions     Gentle range of motion exercises Take medications as prescribed Heating pad will help with pain Return to urgent care if symptoms worsen.  ED  Prescriptions    Medication Sig Dispense Auth. Provider   ibuprofen (ADVIL) 600 MG tablet Take 1 tablet (600 mg total) by mouth every 6 (six) hours as needed. 30 tablet Baron Parmelee, Britta Mccreedy, MD     PDMP not reviewed this encounter.   Merrilee Jansky, MD 12/16/20 [phone]

## 2021-09-06 ENCOUNTER — Encounter (HOSPITAL_COMMUNITY): Payer: Self-pay

## 2021-09-06 ENCOUNTER — Ambulatory Visit (HOSPITAL_COMMUNITY)
Admission: EM | Admit: 2021-09-06 | Discharge: 2021-09-06 | Disposition: A | Discharge status: Home / Self Care | Payer: Medicaid Other | Attending: Emergency Medicine | Admitting: Emergency Medicine

## 2021-09-06 DIAGNOSIS — H6122 Impacted cerumen, left ear: Secondary | ICD-10-CM | POA: Diagnosis not present

## 2021-09-06 NOTE — Discharge Instructions (Addendum)
Please discontinue use of Q-tips to clean your ears.  Please consider purchasing Debrox which is an earwax softener available over-the-counter, if you purchase it and it Comes with a bulb syringe.  I recommend that you use these drops monthly to keep your ears clean.  You can instill them into your ears for 3 to 4 days, then use the bulb syringe and body temperature water to gently flush them out.  Thank you for visiting urgent care today.

## 2021-09-06 NOTE — ED Provider Notes (Signed)
Room and    [facility]    CSN: [id number] Arrival date & time: 09/06/21  1620    HISTORY   Chief Complaint  Patient presents with   Cerumen Impaction   HPI Samantha Warner is a 26 y.o. female. Patient complains of feeling like her left ear is full of wax.  Patient states she has a history of earwax, states difficult to clean out.  Patient states she uses Q-tips to clean her ears but has not been successful in removing the wax that she feels like is there.  Patient states her right ear feels normal at this time.  Patient states that earwax is making it difficult for her to hear.  Patient denies pain in her ear, itching in her ear.  The history is provided by the patient.  Past Medical History:  Diagnosis Date   Anemia    Herpes    Pregnancy induced hypertension    UTI (urinary tract infection)    Patient Active Problem List   Diagnosis Date Noted   Labor and delivery, indication for care 06/17/2017   SVD (spontaneous vaginal delivery) 06/17/2017   Gestational hypertension 10/02/2014   Encounter for fetal anatomic survey    [redacted] weeks gestation of pregnancy    Past Surgical History:  Procedure Laterality Date   NO PAST SURGERIES     OB History     Gravida  2   Para  2   Term  2   Preterm  0   AB  0   Living  2      SAB  0   IAB  0   Ectopic  0   Multiple  0   Live Births  2          Home Medications    Prior to Admission medications   Medication Sig Start Date End Date Taking? Authorizing Provider  ibuprofen (ADVIL) 600 MG tablet Take 1 tablet (600 mg total) by mouth every 6 (six) hours as needed. 12/14/20   Chase Picket, MD  dicyclomine (BENTYL) 20 MG tablet Take 1 tablet as needed for abdominal cramping up to 4 times a day 07/16/19 12/14/20  Darr, Edison Nasuti, PA-C  simethicone (MYLICON) 40 [id number] drops Take 0.6 mLs (40 mg total) by mouth 4 (four) times daily as needed for flatulence. 09/16/18 06/12/19  Zigmund Gottron, NP   Family  History Family History  Problem Relation Age of Onset   Diabetes Maternal Grandmother    Hypertension Maternal Grandmother    Social History Social History   Tobacco Use   Smoking status: Every Day    Types: Cigarettes    Last attempt to quit: 10/14/2016    Years since quitting: 4.8   Smokeless tobacco: Never   Tobacco comments:    smokes marijuana everyday  Vaping Use   Vaping Use: Never used  Substance Use Topics   Alcohol use: Yes    Comment: occasionally   Drug use: Yes    Types: Marijuana, Cocaine    Comment: Denies current cocaine use   Allergies   Peanuts [peanut oil] and Nickel  Review of Systems Review of Systems Pertinent findings noted in history of present illness.   Physical Exam Triage Vital Signs ED Triage Vitals  Enc Vitals Group     BP 05/07/21 0827 (!) 147/82     Pulse Rate 05/07/21 0827 72     Resp 05/07/21 0827 18     Temp 05/07/21 0827 98.3  F (36.8 C)     Temp Source 05/07/21 0827 Oral     SpO2 05/07/21 0827 98 %     Weight --      Height --      Head Circumference --      Peak Flow --      Pain Score 05/07/21 0826 5     Pain Loc --      Pain Edu? --      Excl. in [place]? --   No data found.  Updated Vital Signs BP (!) 148/88 (BP Location: Left Arm)    Pulse 78    Temp 98.6 F (37 C) (Oral)    Resp 18    SpO2 100%   Physical Exam Vitals and nursing note reviewed.  Constitutional:      General: She is not in acute distress.    Appearance: Normal appearance. She is not ill-appearing.  HENT:     Head: Normocephalic and atraumatic.     Salivary Glands: Right salivary gland is not diffusely enlarged or tender. Left salivary gland is not diffusely enlarged or tender.     Right Ear: Tympanic membrane, ear canal and external ear normal. No drainage. No middle ear effusion. There is no impacted cerumen. Tympanic membrane is not erythematous or bulging.     Left Ear: Hearing and external ear normal. There is impacted cerumen.     Nose: Nose  normal. No nasal deformity, septal deviation, mucosal edema, congestion or rhinorrhea.     Right Turbinates: Not enlarged, swollen or pale.     Left Turbinates: Not enlarged, swollen or pale.     Right Sinus: No maxillary sinus tenderness or frontal sinus tenderness.     Left Sinus: No maxillary sinus tenderness or frontal sinus tenderness.     Mouth/Throat:     Lips: Pink. No lesions.     Mouth: Mucous membranes are moist. No oral lesions.     Pharynx: Oropharynx is clear. Uvula midline. No posterior oropharyngeal erythema or uvula swelling.     Tonsils: No tonsillar exudate. 0 on the right. 0 on the left.  Eyes:     General: Lids are normal.        Right eye: No discharge.        Left eye: No discharge.     Extraocular Movements: Extraocular movements intact.     Conjunctiva/sclera: Conjunctivae normal.     Right eye: Right conjunctiva is not injected.     Left eye: Left conjunctiva is not injected.  Neck:     Trachea: Trachea and phonation normal.  Cardiovascular:     Rate and Rhythm: Normal rate and regular rhythm.     Pulses: Normal pulses.     Heart sounds: Normal heart sounds. No murmur heard.   No friction rub. No gallop.  Pulmonary:     Effort: Pulmonary effort is normal. No accessory muscle usage, prolonged expiration or respiratory distress.     Breath sounds: Normal breath sounds. No stridor, decreased air movement or transmitted upper airway sounds. No decreased breath sounds, wheezing, rhonchi or rales.  Chest:     Chest wall: No tenderness.  Musculoskeletal:        General: Normal range of motion.     Cervical back: Normal range of motion and neck supple. Normal range of motion.  Lymphadenopathy:     Cervical: No cervical adenopathy.  Skin:    General: Skin is warm and dry.     Findings: No  erythema or rash.  Neurological:     General: No focal deficit present.     Mental Status: She is alert and oriented to person, place, and time.  Psychiatric:        Mood  and Affect: Mood normal.        Behavior: Behavior normal.    Visual Acuity Right Eye Distance:   Left Eye Distance:   Bilateral Distance:    Right Eye Near:   Left Eye Near:    Bilateral Near:     UC Couse / Diagnostics / Procedures:    EKG  Radiology No results found.  Procedures Ear Cerumen Removal  Date/Time: 09/06/2021 6:19 PM Performed by: Phillip Heal, Demetrice A, CMA Authorized by: Lynden Oxford Scales, PA-C   Consent:    Consent obtained:  Verbal   Consent given by:  Patient   Risks, benefits, and alternatives were discussed: yes     Risks discussed:  Bleeding, infection, incomplete removal, TM perforation, pain and dizziness   Alternatives discussed:  No treatment, delayed treatment, alternative treatment, observation and referral Universal protocol:    Procedure explained and questions answered to patient or proxy's satisfaction: yes     Patient identity confirmed:  Verbally with patient and arm band Procedure details:    Location:  L ear   Procedure type: irrigation     Procedure outcomes: cerumen removed   Post-procedure details:    Inspection:  No bleeding, TM intact and ear canal clear   Hearing quality:  Improved   Procedure completion:  Tolerated (including critical care time)  UC Diagnoses / Final Clinical Impressions(s)   I have reviewed the triage vital signs and the nursing notes.  Pertinent labs & imaging results that were available during my care of the patient were reviewed by me and considered in my medical decision making (see chart for details).   Final diagnoses:  Impacted cerumen of left ear   Patient reports complete resolution of symptoms after removed from left ear canal.  Patient advised she can use Debrox once a month to keep her ears clean.  Patient advised to never put anything in her ear, especially not Q-tips.  ED Prescriptions   None    PDMP not reviewed this encounter.  Pending results:  Labs Reviewed - No data to  display  Medications Ordered in UC: Medications - No data to display  Disposition Upon Discharge:  Condition: stable for discharge home Home: take medications as prescribed; routine discharge instructions as discussed; follow up as advised.  Patient presented with an acute illness with associated systemic symptoms and significant discomfort requiring urgent management. In my opinion, this is a condition that a prudent lay person (someone who possesses an average knowledge of health and medicine) may potentially expect to result in complications if not addressed urgently such as respiratory distress, impairment of bodily function or dysfunction of bodily organs.   Routine symptom specific, illness specific and/or disease specific instructions were discussed with the patient and/or caregiver at length.   As such, the patient has been evaluated and assessed, work-up was performed and treatment was provided in alignment with urgent care protocols and evidence based medicine.  Patient/parent/caregiver has been advised that the patient may require follow up for further testing and treatment if the symptoms continue in spite of treatment, as clinically indicated and appropriate.  If the patient was tested for COVID-19, Influenza and/or RSV, then the patient/parent/guardian was advised to isolate at home pending the results of his/her diagnostic  coronavirus test and potentially longer if theyre positive. I have also advised pt that if his/her COVID-19 test returns positive, it's recommended to self-isolate for at least 10 days after symptoms first appeared AND until fever-free for 24 hours without fever reducer AND other symptoms have improved or resolved. Discussed self-isolation recommendations as well as instructions for household member/close contacts as per the [facility] and [place] DHHS, and also gave patient the [place] packet with this information.  Patient/parent/caregiver has been advised to return to the [facility]  or PCP in 3-5 days if no better; to PCP or the Emergency Department if new signs and symptoms develop, or if the current signs or symptoms continue to change or worsen for further workup, evaluation and treatment as clinically indicated and appropriate  The patient will follow up with their current PCP if and as advised. If the patient does not currently have a PCP we will assist them in obtaining one.   The patient may need specialty follow up if the symptoms continue, in spite of conservative treatment and management, for further workup, evaluation, consultation and treatment as clinically indicated and appropriate.  Patient/parent/caregiver verbalized understanding and agreement of plan as discussed.  All questions were addressed during visit.  Please see discharge instructions below for further details of plan.  Discharge Instructions:   Discharge Instructions      Please discontinue use of Q-tips to clean your ears.  Please consider purchasing Debrox which is an earwax softener available over-the-counter, if you purchase it and it Comes with a bulb syringe.  I recommend that you use these drops monthly to keep your ears clean.  You can instill them into your ears for 3 to 4 days, then use the bulb syringe and body temperature water to gently flush them out.  Thank you for visiting urgent care today.    This office note has been dictated using Museum/gallery curator.  Unfortunately, and despite my best efforts, this method of dictation can sometimes lead to occasional typographical or grammatical errors.  I apologize in advance if this occurs.     Lynden Oxford Scales, [place] 09/06/21 [phone]

## 2021-09-06 NOTE — ED Triage Notes (Signed)
Pt reports ear feels clog up.-

## 2022-04-19 DIAGNOSIS — Z1152 Encounter for screening for COVID-19: Secondary | ICD-10-CM | POA: Diagnosis not present

## 2022-06-08 ENCOUNTER — Ambulatory Visit (HOSPITAL_COMMUNITY)
Admission: EM | Admit: 2022-06-08 | Discharge: 2022-06-08 | Disposition: A | Discharge status: Home / Self Care | Payer: Medicaid Other | Attending: Family Medicine | Admitting: Family Medicine

## 2022-06-08 ENCOUNTER — Encounter (HOSPITAL_COMMUNITY): Payer: Self-pay | Admitting: Emergency Medicine

## 2022-06-08 DIAGNOSIS — R197 Diarrhea, unspecified: Secondary | ICD-10-CM | POA: Diagnosis not present

## 2022-06-08 DIAGNOSIS — R109 Unspecified abdominal pain: Secondary | ICD-10-CM | POA: Diagnosis not present

## 2022-06-08 LAB — POCT URINALYSIS DIPSTICK, ED / UC
Bilirubin Urine: NEGATIVE
Glucose, UA: NEGATIVE mg/dL
Hgb urine dipstick: NEGATIVE
Ketones, ur: NEGATIVE mg/dL
Nitrite: NEGATIVE
Protein, ur: NEGATIVE mg/dL
Specific Gravity, Urine: 1.025 (ref 1.005–1.030)
Urobilinogen, UA: 0.2 mg/dL (ref 0.0–1.0)
pH: 6 (ref 5.0–8.0)

## 2022-06-08 LAB — POC URINE PREG, ED: Preg Test, Ur: NEGATIVE

## 2022-06-08 NOTE — ED Triage Notes (Signed)
Pt c/o intermittent abd pains with diarrhea since yesterday. Denies taking any medication for symptoms.

## 2022-06-08 NOTE — ED Provider Notes (Signed)
[facility]    CSN: [id number] Arrival date & time: 06/08/22  1544      History   Chief Complaint Chief Complaint  Patient presents with   Abdominal Pain   Diarrhea    HPI Samantha Warner is a 26 y.o. female.   Patient presents urgent care for evaluation of 2-day history of diarrhea.  She had a couple episodes yesterday of nonbloody/non-mucousy diarrhea and a couple of episodes today.  She states that the stools have been loose and slightly watery.  She also reports some residual abdominal cramping after bowel movements.  Denies urinary symptoms, vaginal symptoms, current abdominal pain, nausea, vomiting, dizziness, fatigue, weakness, recent antibiotic use, recent steroid/excessive NSAID use, and recent medication changes.  She has Nexplanon implant and denies chance of pregnancy.  Denies recent new sexual partners or exposure to STD.  No known sick contacts.  States symptoms have improved significantly since this morning and she is no longer experiencing any abdominal discomfort.  Missed work today due to symptoms.   Abdominal Pain Associated symptoms: diarrhea   Diarrhea Associated symptoms: abdominal pain     Past Medical History:  Diagnosis Date   Anemia    Herpes    Pregnancy induced hypertension    UTI (urinary tract infection)     Patient Active Problem List   Diagnosis Date Noted   Labor and delivery, indication for care 06/17/2017   SVD (spontaneous vaginal delivery) 06/17/2017   Gestational hypertension 10/02/2014   Encounter for fetal anatomic survey    [redacted] weeks gestation of pregnancy     Past Surgical History:  Procedure Laterality Date   NO PAST SURGERIES      OB History     Gravida  2   Para  2   Term  2   Preterm  0   AB  0   Living  2      SAB  0   IAB  0   Ectopic  0   Multiple  0   Live Births  2            Home Medications    Prior to Admission medications   Medication Sig Start Date End Date Taking?  Authorizing Provider  ibuprofen (ADVIL) 600 MG tablet Take 1 tablet (600 mg total) by mouth every 6 (six) hours as needed. 12/14/20   Merrilee Jansky, MD  dicyclomine (BENTYL) 20 MG tablet Take 1 tablet as needed for abdominal cramping up to 4 times a day 07/16/19 12/14/20  Darr, Gerilyn Pilgrim, PA-C  simethicone (MYLICON) 40 MG/0.6ML drops Take 0.6 mLs (40 mg total) by mouth 4 (four) times daily as needed for flatulence. 09/16/18 06/12/19  Georgetta Haber, NP    Family History Family History  Problem Relation Age of Onset   Diabetes Maternal Grandmother    Hypertension Maternal Grandmother     Social History Social History   Tobacco Use   Smoking status: Every Day    Types: Cigarettes    Last attempt to quit: 10/14/2016    Years since quitting: 5.6   Smokeless tobacco: Never   Tobacco comments:    smokes marijuana everyday  Vaping Use   Vaping Use: Never used  Substance Use Topics   Alcohol use: Yes    Comment: occasionally   Drug use: Yes    Types: Marijuana, Cocaine    Comment: Denies current cocaine use     Allergies   Peanuts [peanut oil] and Nickel  Review of Systems Review of Systems  Gastrointestinal:  Positive for abdominal pain and diarrhea.  Per HPI   Physical Exam Triage Vital Signs ED Triage Vitals  Enc Vitals Group     BP 06/08/22 1629 128/81     Pulse Rate 06/08/22 1629 75     Resp 06/08/22 1629 15     Temp 06/08/22 1629 98.3 F (36.8 C)     Temp Source 06/08/22 1629 Oral     SpO2 06/08/22 1629 98 %     Weight --      Height --      Head Circumference --      Peak Flow --      Pain Score 06/08/22 1628 0     Pain Loc --      Pain Edu? --      Excl. in [place]? --    No data found.  Updated Vital Signs BP 128/81 (BP Location: Right Arm)   Pulse 75   Temp 98.3 F (36.8 C) (Oral)   Resp 15   SpO2 98%   Visual Acuity Right Eye Distance:   Left Eye Distance:   Bilateral Distance:    Right Eye Near:   Left Eye Near:    Bilateral Near:      Physical Exam Vitals and nursing note reviewed.  Constitutional:      Appearance: She is not ill-appearing or toxic-appearing.  HENT:     Head: Normocephalic and atraumatic.     Right Ear: Hearing and external ear normal.     Left Ear: Hearing and external ear normal.     Nose: Nose normal.     Mouth/Throat:     Lips: Pink.  Eyes:     General: Lids are normal. Vision grossly intact. Gaze aligned appropriately.     Extraocular Movements: Extraocular movements intact.     Conjunctiva/sclera: Conjunctivae normal.  Cardiovascular:     Rate and Rhythm: Normal rate and regular rhythm.     Heart sounds: Normal heart sounds, S1 normal and S2 normal.  Pulmonary:     Effort: Pulmonary effort is normal. No respiratory distress.     Breath sounds: Normal breath sounds and air entry.  Abdominal:     General: Bowel sounds are normal.     Palpations: Abdomen is soft.     Tenderness: There is no abdominal tenderness. There is no right CVA tenderness, left CVA tenderness or guarding.  Musculoskeletal:     Cervical back: Neck supple.  Skin:    General: Skin is warm and dry.     Capillary Refill: Capillary refill takes less than 2 seconds.     Findings: No rash.  Neurological:     General: No focal deficit present.     Mental Status: She is alert and oriented to person, place, and time. Mental status is at baseline.     Cranial Nerves: No dysarthria or facial asymmetry.  Psychiatric:        Mood and Affect: Mood normal.        Speech: Speech normal.        Behavior: Behavior normal.        Thought Content: Thought content normal.        Judgment: Judgment normal.      UC Treatments / Results  Labs (all labs ordered are listed, but only abnormal results are displayed) Labs Reviewed  POCT URINALYSIS DIPSTICK, ED / UC - Abnormal; Notable for the following components:  Result Value   Leukocytes,Ua TRACE (*)    All other components within normal limits  POC URINE PREG, ED     EKG   Radiology No results found.  Procedures Procedures (including critical care time)  Medications Ordered in UC Medications - No data to display  Initial Impression / Assessment and Plan / UC Course  I have reviewed the triage vital signs and the nursing notes.  Pertinent labs & imaging results that were available during my care of the patient were reviewed by me and considered in my medical decision making (see chart for details).   1.  Diarrhea Abdominal exam is without peritoneal signs and is stable.  Patient is nontoxic in appearance with hemodynamically stable vital signs, therefore we will defer imaging today and referral to ED for further workup.  Unclear etiology of GI illness, although viral etiology is likely and will resolve with increased fluid intake and bland diet over the next 12 to 24 hours.  She is to return if she experiences severe abdominal pain, fever/chills, blood/mucus in the stools, or worsening new symptoms.  She is agreeable with this plan.   Discussed physical exam and available lab work findings in clinic with patient.  Counseled patient regarding appropriate use of medications and potential side effects for all medications recommended or prescribed today. Discussed red flag signs and symptoms of worsening condition,when to call the PCP office, return to urgent care, and when to seek higher level of care in the emergency department. Patient verbalizes understanding and agreement with plan. All questions answered. Patient discharged in stable condition.    Final Clinical Impressions(s) / UC Diagnoses   Final diagnoses:  Diarrhea, unspecified type     Discharge Instructions      Eat a bland diet over the next 24 hours.  If your diarrhea persists for the next 2 to 3 days or if you develop weakness, blood/mucus in the stool, fever/chills, or severe abdominal pain, please return to urgent care.  Increase your water intake to at least 64 ounces of  water per day to stay well-hydrated.   If you develop any new or worsening symptoms or do not improve in the next 2 to 3 days, please return.  If your symptoms are severe, please go to the emergency room.  Follow-up with your primary care provider for further evaluation and management of your symptoms as well as ongoing wellness visits.  I hope you feel better!   ED Prescriptions   None    PDMP not reviewed this encounter.   Carlisle Beers, [place] 06/08/22 [phone]

## 2022-06-08 NOTE — Discharge Instructions (Addendum)
Eat a bland diet over the next 24 hours.  If your diarrhea persists for the next 2 to 3 days or if you develop weakness, blood/mucus in the stool, fever/chills, or severe abdominal pain, please return to urgent care.  Increase your water intake to at least 64 ounces of water per day to stay well-hydrated.   If you develop any new or worsening symptoms or do not improve in the next 2 to 3 days, please return.  If your symptoms are severe, please go to the emergency room.  Follow-up with your primary care provider for further evaluation and management of your symptoms as well as ongoing wellness visits.  I hope you feel better!

## 2022-06-18 ENCOUNTER — Encounter (HOSPITAL_COMMUNITY): Payer: Self-pay | Admitting: Emergency Medicine

## 2022-06-18 ENCOUNTER — Ambulatory Visit (HOSPITAL_COMMUNITY)
Admission: EM | Admit: 2022-06-18 | Discharge: 2022-06-18 | Disposition: A | Discharge status: Home / Self Care | Payer: Medicaid Other | Attending: Physician Assistant | Admitting: Physician Assistant

## 2022-06-18 DIAGNOSIS — G9331 Postviral fatigue syndrome: Secondary | ICD-10-CM

## 2022-06-18 DIAGNOSIS — R110 Nausea: Secondary | ICD-10-CM

## 2022-06-18 MED ORDER — ONDANSETRON HCL 4 MG PO TABS: 4.0000 mg | ORAL_TABLET | Freq: Three times a day (TID) | ORAL | 0 refills | Status: AC | PRN

## 2022-06-18 MED ORDER — ONDANSETRON 4 MG PO TBDP
ORAL_TABLET | ORAL | Status: AC
  Filled 2022-06-18: qty 1

## 2022-06-18 MED ORDER — ONDANSETRON 4 MG PO TBDP
4.0000 mg | ORAL_TABLET | Freq: Once | ORAL | Status: AC
  Administered 2022-06-18: 4 mg via ORAL

## 2022-06-18 NOTE — ED Triage Notes (Signed)
Pt reports nausea that started today. Vomited small amount this morning. Denies urinary or bowel problems. Wants to be checked to make sure everything is good. Reports on menstrual cycle now.

## 2022-06-18 NOTE — ED Provider Notes (Signed)
[place]    CSN: [id number] Arrival date & time: 06/18/22  1741      History   Chief Complaint Chief Complaint  Patient presents with   Nausea    HPI Samantha Warner is a 26 y.o. female.   26 year old female presents with nausea.  Patient indicates that this morning she started with some nausea, stomach cramping and 1 episode of vomiting.  Patient indicates that she does not have any diarrhea.  She indicates she has not had any fever, chills, has been exposed to any family or friends with similar symptoms.  Patient indicates she has not been eating foods that tasted before she goes concerned about.  Patient indicates that right now she feels fine although she still is having mild stomach cramping pain.     Past Medical History:  Diagnosis Date   Anemia    Herpes    Pregnancy induced hypertension    UTI (urinary tract infection)     Patient Active Problem List   Diagnosis Date Noted   Labor and delivery, indication for care 06/17/2017   SVD (spontaneous vaginal delivery) 06/17/2017   Gestational hypertension 10/02/2014   Encounter for fetal anatomic survey    [redacted] weeks gestation of pregnancy     Past Surgical History:  Procedure Laterality Date   NO PAST SURGERIES      OB History     Gravida  2   Para  2   Term  2   Preterm  0   AB  0   Living  2      SAB  0   IAB  0   Ectopic  0   Multiple  0   Live Births  2            Home Medications    Prior to Admission medications   Medication Sig Start Date End Date Taking? Authorizing Provider  ondansetron (ZOFRAN) 4 MG tablet Take 1 tablet (4 mg total) by mouth every 8 (eight) hours as needed for nausea or vomiting. 06/18/22  Yes Nyoka Lint, PA-C  ibuprofen (ADVIL) 600 MG tablet Take 1 tablet (600 mg total) by mouth every 6 (six) hours as needed. 12/14/20   Chase Picket, MD  dicyclomine (BENTYL) 20 MG tablet Take 1 tablet as needed for abdominal cramping up to 4 times a day  07/16/19 12/14/20  Darr, Edison Nasuti, PA-C  simethicone (MYLICON) 40 [id number] drops Take 0.6 mLs (40 mg total) by mouth 4 (four) times daily as needed for flatulence. 09/16/18 06/12/19  Zigmund Gottron, NP    Family History Family History  Problem Relation Age of Onset   Diabetes Maternal Grandmother    Hypertension Maternal Grandmother     Social History Social History   Tobacco Use   Smoking status: Every Day    Types: Cigarettes    Last attempt to quit: 10/14/2016    Years since quitting: 5.6   Smokeless tobacco: Never   Tobacco comments:    smokes marijuana everyday  Vaping Use   Vaping Use: Never used  Substance Use Topics   Alcohol use: Yes    Comment: occasionally   Drug use: Yes    Types: Marijuana, Cocaine    Comment: Denies current cocaine use     Allergies   Peanuts [peanut oil] and Nickel   Review of Systems Review of Systems  Gastrointestinal:  Positive for nausea and vomiting.     Physical Exam Triage Vital Signs ED  Triage Vitals  Enc Vitals Group     BP 06/18/22 1826 118/65     Pulse Rate 06/18/22 1826 67     Resp 06/18/22 1826 14     Temp 06/18/22 1826 98.4 F (36.9 C)     Temp Source 06/18/22 1826 Oral     SpO2 06/18/22 1826 100 %     Weight --      Height --      Head Circumference --      Peak Flow --      Pain Score 06/18/22 1825 0     Pain Loc --      Pain Edu? --      Excl. in GC? --    No data found.  Updated Vital Signs BP 118/65 (BP Location: Left Arm)   Pulse 67   Temp 98.4 F (36.9 C) (Oral)   Resp 14   LMP 06/15/2022   SpO2 100%   Visual Acuity Right Eye Distance:   Left Eye Distance:   Bilateral Distance:    Right Eye Near:   Left Eye Near:    Bilateral Near:     Physical Exam Constitutional:      Appearance: Normal appearance.  HENT:     Mouth/Throat:     Mouth: Mucous membranes are moist.     Pharynx: Oropharynx is clear.  Cardiovascular:     Rate and Rhythm: Normal rate and regular rhythm.     Heart sounds:  Normal heart sounds.  Pulmonary:     Effort: Pulmonary effort is normal.     Breath sounds: Normal breath sounds and air entry. No wheezing, rhonchi or rales.  Abdominal:     General: Abdomen is flat. Bowel sounds are increased.     Palpations: Abdomen is soft.     Tenderness: There is no abdominal tenderness.  Lymphadenopathy:     Cervical: No cervical adenopathy.  Neurological:     Mental Status: She is alert.      UC Treatments / Results  Labs (all labs ordered are listed, but only abnormal results are displayed) Labs Reviewed - No data to display  EKG   Radiology No results found.  Procedures Procedures (including critical care time)  Medications Ordered in UC Medications  ondansetron (ZOFRAN-ODT) disintegrating tablet 4 mg (4 mg Oral Given 06/18/22 1849)    Initial Impression / Assessment and Plan / UC Course  I have reviewed the triage vital signs and the nursing notes.  Pertinent labs & imaging results that were available during my care of the patient were reviewed by me and considered in my medical decision making (see chart for details).    Plan: 1.  The nausea will be treated with the following: A.  Zofran 4 mg given in the office today. B.  Zofran 4 mg every 6 hours to help control nausea and vomiting. 2.  Post bilateral fatigue syndrome will be treated treated with the following: A.  Advised patient to increase fluid intake and rest of the next 48 hours. 3.  Advised follow-up PCP or return to urgent care if symptoms fail to improve. Final Clinical Impressions(s) / UC Diagnoses   Final diagnoses:  Nausea without vomiting  Postviral fatigue syndrome     Discharge Instructions      Take the Zofran every 6 hourly with a regular basis to help reduce nausea and vomiting. Advised to drink clear liquids, Sprite, temporal, 7-Up.  Advised to observe a bland diet for the next  24 to 48 hours until symptoms resolved. Advised to follow-up PCP or return to  urgent care if symptoms fail to improve.    ED Prescriptions     Medication Sig Dispense Auth. Provider   ondansetron (ZOFRAN) 4 MG tablet Take 1 tablet (4 mg total) by mouth every 8 (eight) hours as needed for nausea or vomiting. 20 tablet Nyoka Lint, PA-C      PDMP not reviewed this encounter.   Nyoka Lint, PA-C 06/18/22 [phone]

## 2022-06-18 NOTE — Discharge Instructions (Signed)
Take the Zofran every 6 hourly with a regular basis to help reduce nausea and vomiting. Advised to drink clear liquids, Sprite, temporal, 7-Up.  Advised to observe a bland diet for the next 24 to 48 hours until symptoms resolved. Advised to follow-up PCP or return to urgent care if symptoms fail to improve.

## 2022-06-24 ENCOUNTER — Ambulatory Visit (HOSPITAL_COMMUNITY)
Admission: EM | Admit: 2022-06-24 | Discharge: 2022-06-24 | Disposition: A | Discharge status: Home / Self Care | Payer: Medicaid Other | Attending: Emergency Medicine | Admitting: Emergency Medicine

## 2022-06-24 ENCOUNTER — Encounter (HOSPITAL_COMMUNITY): Payer: Self-pay | Admitting: *Deleted

## 2022-06-24 DIAGNOSIS — B349 Viral infection, unspecified: Secondary | ICD-10-CM | POA: Diagnosis not present

## 2022-06-24 DIAGNOSIS — Z1152 Encounter for screening for COVID-19: Secondary | ICD-10-CM | POA: Insufficient documentation

## 2022-06-24 DIAGNOSIS — Z79899 Other long term (current) drug therapy: Secondary | ICD-10-CM | POA: Diagnosis not present

## 2022-06-24 MED ORDER — PROMETHAZINE-DM 6.25-15 MG/5ML PO SYRP: 5.0000 mL | ORAL_SOLUTION | Freq: Every evening | ORAL | 0 refills | Status: AC

## 2022-06-24 MED ORDER — FLUTICASONE PROPIONATE 50 MCG/ACT NA SUSP: 2.0000 | Freq: Every day | NASAL | 2 refills | Status: AC

## 2022-06-24 MED ORDER — BENZONATATE 100 MG PO CAPS: 100.0000 mg | ORAL_CAPSULE | Freq: Three times a day (TID) | ORAL | 0 refills | Status: AC

## 2022-06-24 NOTE — ED Triage Notes (Signed)
Pt states she has had a cough and sore throat x 2 days, denies fever. She has been taking Nyquil.

## 2022-06-24 NOTE — Discharge Instructions (Signed)
Promethazine DM has been sent to the pharmacy, you can use this medication before bedtime, please do not operate any heavy machinery or drive a car after taking this medication. Tessalon has been sent to the pharmacy for cough, you can use this medication every 8 hours as needed.  Flonase is a nasal spray can use for nasal congestion, you may use this 2 times daily, place 2 sprays in each nostril with each administration.   I am treating you for a viral illness today.  Viral illnesses usually takes 7 to 10 days to resolve.  Using over-the-counter medications can help treat the symptoms.  You may rotate Tylenol and ibuprofen every 4-6 hours.  For example, take Tylenol now and then 4 to 6 hours later take ibuprofen.  You can use Tylenol for fever and moderate pain, you can take this medication every 4-6 hours, please do not take more than 3000 mg in a 24-hour day.  You can take ibuprofen every 6 hours, do not take more than 2400 mg in a 24-hour day.  I advised that you do not take ibuprofen on an empty stomach, ibuprofen can cause GI problems such as GI bleeding.  Maintaining hydration status is very important, please drink at least 8 cups of water daily.  Please try to intake nutrient dense meals.

## 2022-06-24 NOTE — ED Provider Notes (Signed)
[facility]    CSN: [id number] Arrival date & time: 06/24/22  1808      History   Chief Complaint Chief Complaint  Patient presents with   Cough   Sore Throat    HPI Samantha Warner is a 26 y.o. female.  Patient presents complaining of sore throat and cough x 2 days.  Patient denies any fever, chills, or fatigue.  Patient reports that her throat is worse in the morning and improves as the day goes on.  Patient reports that her kids have been sick with an illness of unknown etiology.  Patient states that she is taking NyQuil with some relief of symptoms.  Patient reports having pneumonia as a child.  Patient denies any chronic health problems as an adult.    Cough Associated symptoms: rhinorrhea and sore throat   Associated symptoms: no chest pain, no chills, no ear pain, no fever, no shortness of breath and no wheezing   Sore Throat Pertinent negatives include no chest pain, no abdominal pain and no shortness of breath.    Past Medical History:  Diagnosis Date   Anemia    Herpes    Pregnancy induced hypertension    UTI (urinary tract infection)     Patient Active Problem List   Diagnosis Date Noted   Labor and delivery, indication for care 06/17/2017   SVD (spontaneous vaginal delivery) 06/17/2017   Gestational hypertension 10/02/2014   Encounter for fetal anatomic survey    [redacted] weeks gestation of pregnancy     Past Surgical History:  Procedure Laterality Date   NO PAST SURGERIES      OB History     Gravida  2   Para  2   Term  2   Preterm  0   AB  0   Living  2      SAB  0   IAB  0   Ectopic  0   Multiple  0   Live Births  2            Home Medications    Prior to Admission medications   Medication Sig Start Date End Date Taking? Authorizing Provider  etonogestrel (NEXPLANON) 68 MG IMPL implant 1 each by Subdermal route once.   Yes [provider]  ibuprofen (ADVIL) 600 MG tablet Take 1 tablet (600 mg total)  by mouth every 6 (six) hours as needed. 12/14/20   Lamptey, Britta Mccreedy, MD  ondansetron (ZOFRAN) 4 MG tablet Take 1 tablet (4 mg total) by mouth every 8 (eight) hours as needed for nausea or vomiting. 06/18/22   Ellsworth Lennox, PA-C  dicyclomine (BENTYL) 20 MG tablet Take 1 tablet as needed for abdominal cramping up to 4 times a day 07/16/19 12/14/20  Darr, Gerilyn Pilgrim, PA-C  simethicone (MYLICON) 40 MG/0.6ML drops Take 0.6 mLs (40 mg total) by mouth 4 (four) times daily as needed for flatulence. 09/16/18 06/12/19  Georgetta Haber, NP    Family History Family History  Problem Relation Age of Onset   Diabetes Maternal Grandmother    Hypertension Maternal Grandmother     Social History Social History   Tobacco Use   Smoking status: Every Day    Types: Cigarettes    Last attempt to quit: 10/14/2016    Years since quitting: 5.6   Smokeless tobacco: Never   Tobacco comments:    smokes marijuana everyday  Vaping Use   Vaping Use: Never used  Substance Use Topics  Alcohol use: Yes    Comment: occasionally   Drug use: Yes    Types: Marijuana, Cocaine    Comment: Denies current cocaine use     Allergies   Peanuts [peanut oil] and Nickel   Review of Systems Review of Systems  Constitutional:  Negative for activity change, appetite change, chills, fatigue and fever.  HENT:  Positive for congestion, postnasal drip, rhinorrhea and sore throat. Negative for ear discharge, ear pain, sinus pressure, sinus pain, sneezing, tinnitus, trouble swallowing and voice change.   Eyes: Negative.   Respiratory:  Positive for cough. Negative for apnea, chest tightness, shortness of breath, wheezing and stridor.   Cardiovascular:  Negative for chest pain and palpitations.  Gastrointestinal: Negative.  Negative for abdominal pain, nausea and vomiting.     Physical Exam Triage Vital Signs ED Triage Vitals  Enc Vitals Group     BP 06/24/22 1943 121/79     Pulse Rate 06/24/22 1943 84     Resp 06/24/22 1943 18      Temp 06/24/22 1943 99.4 F (37.4 C)     Temp Source 06/24/22 1943 Oral     SpO2 06/24/22 1943 98 %     Weight --      Height --      Head Circumference --      Peak Flow --      Pain Score 06/24/22 1941 4     Pain Loc --      Pain Edu? --      Excl. in GC? --    No data found.  Updated Vital Signs BP 121/79 (BP Location: Left Arm)   Pulse 84   Temp 99.4 F (37.4 C) (Oral)   Resp 18   LMP 06/15/2022   SpO2 98%   Visual Acuity Right Eye Distance:   Left Eye Distance:   Bilateral Distance:    Right Eye Near:   Left Eye Near:    Bilateral Near:     Physical Exam Vitals and nursing note reviewed.  Constitutional:      Appearance: She is well-developed.  HENT:     Right Ear: Hearing, tympanic membrane, ear canal and external ear normal.     Left Ear: Hearing, tympanic membrane, ear canal and external ear normal.     Nose: Rhinorrhea present. No congestion. Rhinorrhea is clear.     Right Turbinates: Enlarged and swollen. Not pale.     Left Turbinates: Enlarged and swollen. Not pale.     Right Sinus: No maxillary sinus tenderness or frontal sinus tenderness.     Left Sinus: No maxillary sinus tenderness or frontal sinus tenderness.     Mouth/Throat:     Mouth: Mucous membranes are moist.     Pharynx: Oropharynx is clear. Uvula midline. No pharyngeal swelling, oropharyngeal exudate, posterior oropharyngeal erythema or uvula swelling.     Tonsils: No tonsillar exudate or tonsillar abscesses. 0 on the right. 0 on the left.  Cardiovascular:     Rate and Rhythm: Normal rate and regular rhythm.     Heart sounds: Normal heart sounds, S1 normal and S2 normal.  Pulmonary:     Effort: Pulmonary effort is normal.     Breath sounds: Normal breath sounds and air entry. No decreased breath sounds, wheezing, rhonchi or rales.  Lymphadenopathy:     Head:     Right side of head: No tonsillar adenopathy.     Left side of head: No tonsillar adenopathy.  Cervical: No cervical  adenopathy.  Neurological:     Mental Status: She is alert.  Psychiatric:        Behavior: Behavior is cooperative.      UC Treatments / Results  Labs (all labs ordered are listed, but only abnormal results are displayed) Labs Reviewed  SARS CORONAVIRUS 2 (TAT 6-24 HRS)    EKG   Radiology No results found.  Procedures Procedures (including critical care time)  Medications Ordered in UC Medications - No data to display  Initial Impression / Assessment and Plan / UC Course  I have reviewed the triage vital signs and the nursing notes.  Pertinent labs & imaging results that were available during my care of the patient were reviewed by me and considered in my medical decision making (see chart for details).     Patient was evaluated for viral illness. COVID and Flu is pending. Promethazine DM, Tessalon, and Flonase sent to pharmacy. Patient made aware of....  Final Clinical Impressions(s) / UC Diagnoses   Final diagnoses:  Viral illness   Discharge Instructions   None    ED Prescriptions   None    PDMP not reviewed this encounter.

## 2022-06-25 LAB — SARS CORONAVIRUS 2 (TAT 6-24 HRS): SARS Coronavirus 2: NEGATIVE

## 2022-07-05 ENCOUNTER — Ambulatory Visit (HOSPITAL_COMMUNITY)
Admission: EM | Admit: 2022-07-05 | Discharge: 2022-07-05 | Disposition: A | Discharge status: Home / Self Care | Payer: Medicaid Other | Attending: Emergency Medicine | Admitting: Emergency Medicine

## 2022-07-05 ENCOUNTER — Encounter (HOSPITAL_COMMUNITY): Payer: Self-pay

## 2022-07-05 DIAGNOSIS — J029 Acute pharyngitis, unspecified: Secondary | ICD-10-CM | POA: Diagnosis not present

## 2022-07-05 LAB — POCT RAPID STREP A, ED / UC: Streptococcus, Group A Screen (Direct): NEGATIVE

## 2022-07-05 MED ORDER — LIDOCAINE VISCOUS HCL 2 % MT SOLN: 15.0000 mL | Freq: Four times a day (QID) | OROMUCOSAL | 0 refills | Status: AC | PRN

## 2022-07-05 NOTE — ED Provider Notes (Signed)
[facility]    CSN: [id number] Arrival date & time: 07/05/22  1739      History   Chief Complaint Chief Complaint  Patient presents with   Sore Throat    HPI Samantha Warner is a 26 y.o. female.   Patient presents today with a 2-day history of sore throat.  She reports some mild nasal congestion but otherwise has no additional symptoms.  Denies any fever, dysphagia, muffled voice, swelling of her throat, shortness of breath, cough.  Reports her children have been sick but they have had different symptoms.  She denies additional known sick contacts or exposure to strep pharyngitis.  She has tried Robitussin and Chloraseptic spray with improvement of symptoms.  Denies any recent antibiotics or steroids.  She is confident that she is not pregnant.    Past Medical History:  Diagnosis Date   Anemia    Herpes    Pregnancy induced hypertension    UTI (urinary tract infection)     Patient Active Problem List   Diagnosis Date Noted   Labor and delivery, indication for care 06/17/2017   SVD (spontaneous vaginal delivery) 06/17/2017   Gestational hypertension 10/02/2014   Encounter for fetal anatomic survey    [redacted] weeks gestation of pregnancy     Past Surgical History:  Procedure Laterality Date   NO PAST SURGERIES      OB History     Gravida  2   Para  2   Term  2   Preterm  0   AB  0   Living  2      SAB  0   IAB  0   Ectopic  0   Multiple  0   Live Births  2            Home Medications    Prior to Admission medications   Medication Sig Start Date End Date Taking? Authorizing Provider  lidocaine (XYLOCAINE) 2 % solution Use as directed 15 mLs in the mouth or throat every 6 (six) hours as needed for mouth pain. 07/05/22  Yes Richard Holz K, PA-C  benzonatate (TESSALON) 100 MG capsule Take 1 capsule (100 mg total) by mouth every 8 (eight) hours. 06/24/22   Debby Freiberg, NP  etonogestrel (NEXPLANON) 68 MG IMPL implant 1 each by  Subdermal route once.    [provider]  fluticasone (FLONASE) 50 MCG/ACT nasal spray Place 2 sprays into both nostrils daily. 06/24/22   Debby Freiberg, NP  ibuprofen (ADVIL) 600 MG tablet Take 1 tablet (600 mg total) by mouth every 6 (six) hours as needed. 12/14/20   Lamptey, Britta Mccreedy, MD  ondansetron (ZOFRAN) 4 MG tablet Take 1 tablet (4 mg total) by mouth every 8 (eight) hours as needed for nausea or vomiting. 06/18/22   Ellsworth Lennox, PA-C  promethazine-dextromethorphan (PROMETHAZINE-DM) 6.25-15 MG/5ML syrup Take 5 mLs by mouth at bedtime. 06/24/22   Debby Freiberg, NP  dicyclomine (BENTYL) 20 MG tablet Take 1 tablet as needed for abdominal cramping up to 4 times a day 07/16/19 12/14/20  Darr, Gerilyn Pilgrim, PA-C  simethicone (MYLICON) 40 MG/0.6ML drops Take 0.6 mLs (40 mg total) by mouth 4 (four) times daily as needed for flatulence. 09/16/18 06/12/19  Georgetta Haber, NP    Family History Family History  Problem Relation Age of Onset   Diabetes Maternal Grandmother    Hypertension Maternal Grandmother     Social History Social History   Tobacco Use  Smoking status: Every Day    Types: Cigarettes    Last attempt to quit: 10/14/2016    Years since quitting: 5.7   Smokeless tobacco: Never   Tobacco comments:    smokes marijuana everyday  Vaping Use   Vaping Use: Never used  Substance Use Topics   Alcohol use: Yes    Comment: occasionally   Drug use: Yes    Types: Marijuana, Cocaine    Comment: Denies current cocaine use     Allergies   Peanuts [peanut oil] and Nickel   Review of Systems Review of Systems  Constitutional:  Positive for activity change. Negative for appetite change, fatigue and fever.  HENT:  Positive for congestion and sore throat. Negative for sinus pressure, sneezing, trouble swallowing and voice change.   Respiratory:  Negative for cough and shortness of breath.   Cardiovascular:  Negative for chest pain.  Gastrointestinal:  Negative for  abdominal pain, diarrhea, nausea and vomiting.     Physical Exam Triage Vital Signs ED Triage Vitals  Enc Vitals Group     BP 07/05/22 2030 (!) 137/58     Pulse Rate 07/05/22 2029 77     Resp 07/05/22 2029 16     Temp 07/05/22 2029 98.4 F (36.9 C)     Temp Source 07/05/22 2029 Oral     SpO2 07/05/22 2029 99 %     Weight --      Height --      Head Circumference --      Peak Flow --      Pain Score 07/05/22 2030 4     Pain Loc --      Pain Edu? --      Excl. in GC? --    No data found.  Updated Vital Signs BP (!) 137/58   Pulse 78   Temp 98.4 F (36.9 C) (Oral)   Resp 16   LMP 06/15/2022   SpO2 99%   Visual Acuity Right Eye Distance:   Left Eye Distance:   Bilateral Distance:    Right Eye Near:   Left Eye Near:    Bilateral Near:     Physical Exam Vitals reviewed.  Constitutional:      General: She is awake. She is not in acute distress.    Appearance: Normal appearance. She is well-developed. She is not ill-appearing.     Comments: Very pleasant female appears stated age in no acute distress sitting comfortably in exam room  HENT:     Head: Normocephalic and atraumatic.     Right Ear: Tympanic membrane, ear canal and external ear normal. Tympanic membrane is not erythematous or bulging.     Left Ear: Tympanic membrane, ear canal and external ear normal. Tympanic membrane is not erythematous or bulging.     Nose:     Right Sinus: No maxillary sinus tenderness or frontal sinus tenderness.     Left Sinus: No maxillary sinus tenderness or frontal sinus tenderness.     Mouth/Throat:     Pharynx: Uvula midline. Posterior oropharyngeal erythema present. No oropharyngeal exudate.     Tonsils: No tonsillar exudate or tonsillar abscesses.  Cardiovascular:     Rate and Rhythm: Normal rate and regular rhythm.     Heart sounds: Normal heart sounds, S1 normal and S2 normal. No murmur heard. Pulmonary:     Effort: Pulmonary effort is normal.     Breath sounds:  Normal breath sounds. No wheezing, rhonchi or rales.  Comments: Clear to auscultation bilaterally Lymphadenopathy:     Head:     Right side of head: No submental, submandibular or tonsillar adenopathy.     Left side of head: No submental, submandibular or tonsillar adenopathy.     Cervical: No cervical adenopathy.  Psychiatric:        Behavior: Behavior is cooperative.      UC Treatments / Results  Labs (all labs ordered are listed, but only abnormal results are displayed) Labs Reviewed  CULTURE, GROUP A STREP [facility])  POCT RAPID STREP A, ED / UC    EKG   Radiology No results found.  Procedures Procedures (including critical care time)  Medications Ordered in UC Medications - No data to display  Initial Impression / Assessment and Plan / UC Course  I have reviewed the triage vital signs and the nursing notes.  Pertinent labs & imaging results that were available during my care of the patient were reviewed by me and considered in my medical decision making (see chart for details).     Patient is well-appearing, afebrile, nontoxic, nontachycardic.  Strep testing was negative in clinic today.  Throat culture is pending.  Discussed likely viral etiology.  Patient was encouraged to use conservative treatment measures including gargling with warm salt water and alternating Tylenol and ibuprofen.  She was prescribed lidocaine to help with sore throat symptoms with instruction not to eat or drink immediately after using this medication as it increases risk of choking.  Discussed that if her symptoms are improving or if anything worsens she needs to be seen again.  If she develops any high fever, chest pain, shortness of breath, swelling of her throat, muffled voice, dysphagia she needs to go to the emergency room.  Strict return precautions given.  Work excuse note provided.  Final Clinical Impressions(s) / UC Diagnoses   Final diagnoses:  Viral pharyngitis  Sore throat      Discharge Instructions      Your strep test was negative.  We will contact you if your culture is positive and we need to start an antibiotic.  Please gargle with salt water and use Tylenol and ibuprofen for your pain.  You can use lidocaine up to every 6 hours.  Do not eat or drink within 15 to 20 minutes of using this medication as it increases risk of choking.  If your symptoms are proving within a few days please return for reevaluation.  If anything worsens and you have swelling of your throat, shortness of breath, fever, muffled voice, you should be seen immediately.     ED Prescriptions     Medication Sig Dispense Auth. Provider   lidocaine (XYLOCAINE) 2 % solution Use as directed 15 mLs in the mouth or throat every 6 (six) hours as needed for mouth pain. 100 mL Koren Sermersheim K, PA-C      PDMP not reviewed this encounter.   Jeani Hawking, PA-C 07/05/22 2217

## 2022-07-05 NOTE — Discharge Instructions (Signed)
Your strep test was negative.  We will contact you if your culture is positive and we need to start an antibiotic.  Please gargle with salt water and use Tylenol and ibuprofen for your pain.  You can use lidocaine up to every 6 hours.  Do not eat or drink within 15 to 20 minutes of using this medication as it increases risk of choking.  If your symptoms are proving within a few days please return for reevaluation.  If anything worsens and you have swelling of your throat, shortness of breath, fever, muffled voice, you should be seen immediately.

## 2022-07-05 NOTE — ED Triage Notes (Signed)
Pt reports sore throat x 2 days

## 2022-07-09 LAB — CULTURE, GROUP A STREP (THRC)

## 2024-02-18 ENCOUNTER — Encounter (HOSPITAL_COMMUNITY): Payer: Self-pay | Admitting: *Deleted

## 2024-02-18 ENCOUNTER — Emergency Department (HOSPITAL_COMMUNITY)
Admission: EM | Admit: 2024-02-18 | Discharge: 2024-02-18 | Discharge status: Against Medical Advice | Attending: Emergency Medicine | Admitting: Emergency Medicine

## 2024-02-18 ENCOUNTER — Emergency Department (HOSPITAL_COMMUNITY)

## 2024-02-18 ENCOUNTER — Other Ambulatory Visit: Payer: Self-pay

## 2024-02-18 DIAGNOSIS — M25562 Pain in left knee: Secondary | ICD-10-CM | POA: Insufficient documentation

## 2024-02-18 DIAGNOSIS — Z5321 Procedure and treatment not carried out due to patient leaving prior to being seen by health care provider: Secondary | ICD-10-CM | POA: Diagnosis not present

## 2024-02-18 DIAGNOSIS — S8992XD Unspecified injury of left lower leg, subsequent encounter: Secondary | ICD-10-CM | POA: Diagnosis not present

## 2024-02-18 NOTE — ED Notes (Signed)
Pt seen leaving by registration staff.

## 2024-02-18 NOTE — ED Triage Notes (Signed)
 The pt is c/o lt knee pain she  was dancing and did a split and since then her knee has been hurting   1-2 weesk ago  lmp now
